# Patient Record
Sex: Male | Born: 1960
Health system: Southern US, Community
[De-identification: ages and names within clinical notes are randomized; demographics above are authoritative.]

## PROBLEM LIST (undated history)

## (undated) DIAGNOSIS — R51 Headache: Secondary | ICD-10-CM

## (undated) DIAGNOSIS — R569 Unspecified convulsions: Secondary | ICD-10-CM

## (undated) DIAGNOSIS — M1990 Unspecified osteoarthritis, unspecified site: Secondary | ICD-10-CM

## (undated) DIAGNOSIS — I10 Essential (primary) hypertension: Secondary | ICD-10-CM

## (undated) DIAGNOSIS — E039 Hypothyroidism, unspecified: Secondary | ICD-10-CM

## (undated) DIAGNOSIS — I2510 Atherosclerotic heart disease of native coronary artery without angina pectoris: Secondary | ICD-10-CM

## (undated) DIAGNOSIS — Z87442 Personal history of urinary calculi: Secondary | ICD-10-CM

## (undated) HISTORY — PX: OTHER SURGICAL HISTORY: SHX169

---

## 2001-09-07 HISTORY — PX: BACK SURGERY: SHX140

## 2002-06-20 ENCOUNTER — Ambulatory Visit (HOSPITAL_COMMUNITY): Admission: RE | Admit: 2002-06-20 | Discharge: 2002-06-21 | Payer: Self-pay | Admitting: Neurosurgery

## 2002-06-20 ENCOUNTER — Encounter: Payer: Self-pay | Admitting: Neurosurgery

## 2002-07-13 ENCOUNTER — Encounter: Admission: RE | Admit: 2002-07-13 | Discharge: 2002-07-13 | Payer: Self-pay | Admitting: Neurosurgery

## 2002-07-13 ENCOUNTER — Encounter: Payer: Self-pay | Admitting: Neurosurgery

## 2004-08-20 ENCOUNTER — Ambulatory Visit: Payer: Self-pay | Admitting: Family Medicine

## 2004-09-24 ENCOUNTER — Ambulatory Visit: Payer: Self-pay | Admitting: Family Medicine

## 2005-05-18 ENCOUNTER — Ambulatory Visit: Payer: Self-pay | Admitting: Family Medicine

## 2006-09-07 HISTORY — PX: BACK SURGERY: SHX140

## 2006-10-26 ENCOUNTER — Ambulatory Visit (HOSPITAL_COMMUNITY): Admission: RE | Admit: 2006-10-26 | Discharge: 2006-10-26 | Payer: Self-pay | Admitting: Neurosurgery

## 2012-12-12 ENCOUNTER — Encounter (HOSPITAL_COMMUNITY): Payer: Self-pay | Admitting: Pharmacy Technician

## 2012-12-12 ENCOUNTER — Encounter (HOSPITAL_COMMUNITY): Payer: Self-pay | Admitting: *Deleted

## 2012-12-12 NOTE — Pre-Procedure Instructions (Addendum)
Asked to bring blue folder the day of the procedure,insurance card,I.D. driver's license,wear comfortable clothing and have a driver for the day. Asked not to take Advil,Motrin,Ibuprofen,Aleve or any NSAIDS, Aspirin, Aspirin products or Toradol for 72 hours prior to procedure,  No vitamins or herbal medications 7 days prior to procedure. Instructed to take laxative per doctor's office instructions and eat a light dinner the evening before procedure.   To arrive at 1530  for lithotripsy procedure. 

## 2012-12-15 ENCOUNTER — Ambulatory Visit (HOSPITAL_COMMUNITY): Payer: BC Managed Care – PPO

## 2012-12-15 ENCOUNTER — Encounter (HOSPITAL_COMMUNITY)
Admission: RE | Disposition: A | Discharge status: Home / Self Care | Payer: Self-pay | Source: Ambulatory Visit | Attending: Urology

## 2012-12-15 ENCOUNTER — Ambulatory Visit (HOSPITAL_COMMUNITY)
Admission: RE | Admit: 2012-12-15 | Discharge: 2012-12-15 | Disposition: A | Discharge status: Home / Self Care | Payer: BC Managed Care – PPO | Source: Ambulatory Visit | Attending: Urology | Admitting: Urology

## 2012-12-15 ENCOUNTER — Encounter (HOSPITAL_COMMUNITY): Payer: Self-pay | Admitting: *Deleted

## 2012-12-15 DIAGNOSIS — N200 Calculus of kidney: Secondary | ICD-10-CM | POA: Insufficient documentation

## 2012-12-15 DIAGNOSIS — E039 Hypothyroidism, unspecified: Secondary | ICD-10-CM | POA: Insufficient documentation

## 2012-12-15 DIAGNOSIS — I10 Essential (primary) hypertension: Secondary | ICD-10-CM | POA: Insufficient documentation

## 2012-12-15 HISTORY — DX: Hypothyroidism, unspecified: E03.9

## 2012-12-15 HISTORY — DX: Unspecified convulsions: R56.9

## 2012-12-15 HISTORY — DX: Headache: R51

## 2012-12-15 SURGERY — LITHOTRIPSY, ESWL
Anesthesia: LOCAL | Laterality: Right

## 2012-12-15 MED ORDER — DIAZEPAM 5 MG PO TABS
10.0000 mg | ORAL_TABLET | Freq: Once | ORAL | Status: AC
  Administered 2012-12-15: 10 mg via ORAL
  Filled 2012-12-15: qty 2

## 2012-12-15 MED ORDER — LEVOFLOXACIN IN D5W 500 MG/100ML IV SOLN
500.0000 mg | Freq: Once | INTRAVENOUS | Status: AC
  Administered 2012-12-15: 500 mg via INTRAVENOUS
  Filled 2012-12-15: qty 100

## 2012-12-15 MED ORDER — GENTAMICIN SULFATE 40 MG/ML IJ SOLN
5.0000 mg/kg | Freq: Once | INTRAMUSCULAR | Status: DC
  Filled 2012-12-15: qty 8.84

## 2012-12-15 MED ORDER — DIAZEPAM 2 MG PO TABS: 10.0000 mg | ORAL_TABLET | Freq: Once | ORAL | Status: DC

## 2012-12-15 MED ORDER — DIPHENHYDRAMINE HCL 25 MG PO TABS
25.0000 mg | ORAL_TABLET | Freq: Once | ORAL | Status: AC
  Administered 2012-12-15: 25 mg via ORAL
  Filled 2012-12-15 (×2): qty 1

## 2012-12-15 MED ORDER — LACTATED RINGERS IV SOLN
INTRAVENOUS | Status: DC
  Administered 2012-12-15: 16:00:00 via INTRAVENOUS

## 2012-12-15 NOTE — Progress Notes (Addendum)
Levaquin started at 1623. Patient began sneezing (12 times), eyes starting itching, slightly puffy and reddened. Levaquin stopped at 1650. Continued Lactated Ringers. Stated he felt better and has this reaction after mowing yard  (he did today) due to seasonal allergies. Called Dr. Saddie Benders. Instructed to give Gentamicin instead. Gentamicin arrived after patient went to [place] truck. Steward Drone (nurse on litho truck) hung Gentamicin and documented.

## 2015-12-31 DIAGNOSIS — J018 Other acute sinusitis: Secondary | ICD-10-CM | POA: Diagnosis not present

## 2016-01-22 DIAGNOSIS — E782 Mixed hyperlipidemia: Secondary | ICD-10-CM | POA: Diagnosis not present

## 2016-01-22 DIAGNOSIS — I10 Essential (primary) hypertension: Secondary | ICD-10-CM | POA: Diagnosis not present

## 2016-01-22 DIAGNOSIS — R7301 Impaired fasting glucose: Secondary | ICD-10-CM | POA: Diagnosis not present

## 2016-01-22 DIAGNOSIS — E034 Atrophy of thyroid (acquired): Secondary | ICD-10-CM | POA: Diagnosis not present

## 2016-02-26 DIAGNOSIS — K219 Gastro-esophageal reflux disease without esophagitis: Secondary | ICD-10-CM | POA: Diagnosis not present

## 2016-03-24 DIAGNOSIS — H5213 Myopia, bilateral: Secondary | ICD-10-CM | POA: Diagnosis not present

## 2016-06-30 DIAGNOSIS — M7712 Lateral epicondylitis, left elbow: Secondary | ICD-10-CM | POA: Diagnosis not present

## 2016-07-24 DIAGNOSIS — I10 Essential (primary) hypertension: Secondary | ICD-10-CM | POA: Diagnosis not present

## 2016-07-24 DIAGNOSIS — R7301 Impaired fasting glucose: Secondary | ICD-10-CM | POA: Diagnosis not present

## 2016-07-24 DIAGNOSIS — E782 Mixed hyperlipidemia: Secondary | ICD-10-CM | POA: Diagnosis not present

## 2016-07-24 DIAGNOSIS — E034 Atrophy of thyroid (acquired): Secondary | ICD-10-CM | POA: Diagnosis not present

## 2016-07-28 DIAGNOSIS — R7301 Impaired fasting glucose: Secondary | ICD-10-CM | POA: Diagnosis not present

## 2016-07-28 DIAGNOSIS — E782 Mixed hyperlipidemia: Secondary | ICD-10-CM | POA: Diagnosis not present

## 2016-07-28 DIAGNOSIS — E034 Atrophy of thyroid (acquired): Secondary | ICD-10-CM | POA: Diagnosis not present

## 2016-07-28 DIAGNOSIS — I10 Essential (primary) hypertension: Secondary | ICD-10-CM | POA: Diagnosis not present

## 2016-09-07 HISTORY — PX: BACK SURGERY: SHX140

## 2016-11-30 DIAGNOSIS — R55 Syncope and collapse: Secondary | ICD-10-CM | POA: Diagnosis not present

## 2016-11-30 DIAGNOSIS — M7712 Lateral epicondylitis, left elbow: Secondary | ICD-10-CM | POA: Diagnosis not present

## 2016-12-01 DIAGNOSIS — R55 Syncope and collapse: Secondary | ICD-10-CM | POA: Diagnosis not present

## 2016-12-22 DIAGNOSIS — Z23 Encounter for immunization: Secondary | ICD-10-CM | POA: Diagnosis not present

## 2016-12-23 DIAGNOSIS — Z23 Encounter for immunization: Secondary | ICD-10-CM | POA: Diagnosis not present

## 2017-01-28 DIAGNOSIS — L814 Other melanin hyperpigmentation: Secondary | ICD-10-CM | POA: Diagnosis not present

## 2017-01-28 DIAGNOSIS — L820 Inflamed seborrheic keratosis: Secondary | ICD-10-CM | POA: Diagnosis not present

## 2017-01-28 DIAGNOSIS — L578 Other skin changes due to chronic exposure to nonionizing radiation: Secondary | ICD-10-CM | POA: Diagnosis not present

## 2017-02-09 DIAGNOSIS — M5416 Radiculopathy, lumbar region: Secondary | ICD-10-CM | POA: Diagnosis not present

## 2017-02-12 ENCOUNTER — Other Ambulatory Visit: Payer: Self-pay | Admitting: Neurosurgery

## 2017-02-12 DIAGNOSIS — M5416 Radiculopathy, lumbar region: Secondary | ICD-10-CM

## 2017-02-15 ENCOUNTER — Ambulatory Visit
Admission: RE | Admit: 2017-02-15 | Discharge: 2017-02-15 | Disposition: A | Discharge status: Home / Self Care | Payer: BLUE CROSS/BLUE SHIELD | Source: Ambulatory Visit | Attending: Neurosurgery | Admitting: Neurosurgery

## 2017-02-15 DIAGNOSIS — M5416 Radiculopathy, lumbar region: Secondary | ICD-10-CM

## 2017-02-15 DIAGNOSIS — M48061 Spinal stenosis, lumbar region without neurogenic claudication: Secondary | ICD-10-CM | POA: Diagnosis not present

## 2017-02-15 MED ORDER — GADOBENATE DIMEGLUMINE 529 MG/ML IV SOLN
20.0000 mL | Freq: Once | INTRAVENOUS | Status: AC | PRN
  Administered 2017-02-15: 20 mL via INTRAVENOUS

## 2017-03-04 DIAGNOSIS — M4310 Spondylolisthesis, site unspecified: Secondary | ICD-10-CM | POA: Diagnosis not present

## 2017-03-04 DIAGNOSIS — M7138 Other bursal cyst, other site: Secondary | ICD-10-CM | POA: Diagnosis not present

## 2017-03-04 DIAGNOSIS — M5416 Radiculopathy, lumbar region: Secondary | ICD-10-CM | POA: Diagnosis not present

## 2017-03-05 ENCOUNTER — Other Ambulatory Visit: Payer: Self-pay | Admitting: Neurosurgery

## 2017-03-08 DIAGNOSIS — I9589 Other hypotension: Secondary | ICD-10-CM | POA: Diagnosis not present

## 2017-03-08 DIAGNOSIS — R5383 Other fatigue: Secondary | ICD-10-CM | POA: Diagnosis not present

## 2017-04-08 DIAGNOSIS — I10 Essential (primary) hypertension: Secondary | ICD-10-CM | POA: Diagnosis not present

## 2017-05-07 ENCOUNTER — Other Ambulatory Visit (HOSPITAL_COMMUNITY): Payer: Self-pay | Admitting: *Deleted

## 2017-05-07 ENCOUNTER — Encounter (HOSPITAL_COMMUNITY): Payer: Self-pay

## 2017-05-07 ENCOUNTER — Encounter (HOSPITAL_COMMUNITY)
Admission: RE | Admit: 2017-05-07 | Discharge: 2017-05-07 | Disposition: A | Discharge status: Home / Self Care | Payer: BLUE CROSS/BLUE SHIELD | Source: Ambulatory Visit | Attending: Neurosurgery | Admitting: Neurosurgery

## 2017-05-07 DIAGNOSIS — I10 Essential (primary) hypertension: Secondary | ICD-10-CM | POA: Insufficient documentation

## 2017-05-07 DIAGNOSIS — Z0183 Encounter for blood typing: Secondary | ICD-10-CM | POA: Insufficient documentation

## 2017-05-07 DIAGNOSIS — Z01818 Encounter for other preprocedural examination: Secondary | ICD-10-CM | POA: Insufficient documentation

## 2017-05-07 DIAGNOSIS — R001 Bradycardia, unspecified: Secondary | ICD-10-CM | POA: Diagnosis not present

## 2017-05-07 DIAGNOSIS — M7138 Other bursal cyst, other site: Secondary | ICD-10-CM | POA: Insufficient documentation

## 2017-05-07 DIAGNOSIS — Z01812 Encounter for preprocedural laboratory examination: Secondary | ICD-10-CM | POA: Insufficient documentation

## 2017-05-07 HISTORY — DX: Essential (primary) hypertension: I10

## 2017-05-07 HISTORY — DX: Personal history of urinary calculi: Z87.442

## 2017-05-07 LAB — CBC WITH DIFFERENTIAL/PLATELET
BASOS PCT: 1 %
Basophils Absolute: 0.1 10*3/uL (ref 0.0–0.1)
EOS ABS: 0.3 10*3/uL (ref 0.0–0.7)
Eosinophils Relative: 4 %
HCT: 44.8 % (ref 39.0–52.0)
Hemoglobin: 15.2 g/dL (ref 13.0–17.0)
LYMPHS ABS: 1.5 10*3/uL (ref 0.7–4.0)
Lymphocytes Relative: 21 %
MCH: 30.6 pg (ref 26.0–34.0)
MCHC: 33.9 g/dL (ref 30.0–36.0)
MCV: 90.1 fL (ref 78.0–100.0)
MONO ABS: 0.6 10*3/uL (ref 0.1–1.0)
Monocytes Relative: 9 %
Neutro Abs: 4.6 10*3/uL (ref 1.7–7.7)
Neutrophils Relative %: 65 %
Platelets: 210 10*3/uL (ref 150–400)
RBC: 4.97 MIL/uL (ref 4.22–5.81)
RDW: 12.7 % (ref 11.5–15.5)
WBC: 7.1 10*3/uL (ref 4.0–10.5)

## 2017-05-07 LAB — BASIC METABOLIC PANEL
ANION GAP: 7 (ref 5–15)
BUN: 13 mg/dL (ref 6–20)
CHLORIDE: 105 mmol/L (ref 101–111)
CO2: 28 mmol/L (ref 22–32)
Calcium: 9.8 mg/dL (ref 8.9–10.3)
Creatinine, Ser: 0.9 mg/dL (ref 0.61–1.24)
GFR calc non Af Amer: >60 mL/min (ref >60)
GLUCOSE: 107 mg/dL — AB (ref 65–99)
POTASSIUM: 4.7 mmol/L (ref 3.5–5.1)
Sodium: 140 mmol/L (ref 135–145)

## 2017-05-07 LAB — SURGICAL PCR SCREEN
MRSA, PCR: NEGATIVE
Staphylococcus aureus: NEGATIVE

## 2017-05-07 LAB — ABO/RH: ABO/RH(D): A POS

## 2017-05-07 LAB — TYPE AND SCREEN
ABO/RH(D): A POS
ANTIBODY SCREEN: NEGATIVE

## 2017-05-07 NOTE — Pre-Procedure Instructions (Signed)
Anthony Soto  05/07/2017      [facility] - [place] - [address] [place] [address] Phone: [phone] Fax: [phone]  [facility] - [place], [place] - [address] [place] [address] Phone: [phone] Fax: [phone]   Your procedure is scheduled on Tuesday, May 18, 2017 at 8:00 AM.   Report to [facility] Entrance "A" Admitting Office at 6:00 AM.   Call this number if you have problems the morning of surgery: [phone]   Questions prior to day of surgery, please call [phone] between 8 & 4 PM.   Remember:  Do not eat food or drink liquids after midnight Monday, 05/17/17.  Take these medicines the morning of surgery with A SIP OF WATER: Levothyroxine (Synthroid), Loratadine (Claritin) - if needed  Stop Aspirin 5 days prior to surgery. Do not use NSAIDS (Ibuprofen, Aleve, etc) 5 days prior to surgery.   Do not wear jewelry.  Do not wear lotions, powders, cologne or deodorant.  Men may shave face and neck.  Do not bring valuables to the hospital.  [facility] is not responsible for any belongings or valuables.  Contacts, dentures or bridgework may not be worn into surgery.  Leave your suitcase in the car.  After surgery it may be brought to your room.  For patients admitted to the hospital, discharge time will be determined by your treatment team.  [facility] - Preparing for Surgery  Before surgery, you can play an important role.  Because skin is not sterile, your skin needs to be as free of germs as possible.  You can reduce the number of germs on you skin by washing with CHG (chlorahexidine gluconate) soap before surgery.  CHG is an antiseptic cleaner which kills germs and bonds with the skin to continue killing germs even after washing.  Please DO NOT use if you have an allergy to CHG or antibacterial soaps.  If your skin becomes reddened/irritated  stop using the CHG and inform your nurse when you arrive at Short Stay.  Do not shave (including legs and underarms) for at least 48 hours prior to the first CHG shower.  You may shave your face.  Please follow these instructions carefully:   1.  Shower with CHG Soap the night before surgery and the                    morning of Surgery.  2.  If you choose to wash your hair, wash your hair first as usual with your       normal shampoo.  3.  After you shampoo, rinse your hair and body thoroughly to remove the shampoo.  4.  Use CHG as you would any other liquid soap.  You can apply chg directly       to the skin and wash gently with scrungie or a clean washcloth.  5.  Apply the CHG Soap to your body ONLY FROM THE NECK DOWN.        Do not use on open wounds or open sores.  Avoid contact with your eyes, ears, mouth and genitals (private parts).  Wash genitals (private parts)  with your normal soap.  6.  Wash thoroughly, paying special attention to the area where your surgery        will be performed.  7.  Thoroughly rinse your body with warm water  from the neck down.  8.  DO NOT shower/wash with your normal soap after using and rinsing off       the CHG Soap.  9.  Pat yourself dry with a clean towel.            10.  Wear clean pajamas.            11.  Place clean sheets on your bed the night of your first shower and do not        sleep with pets.  Day of Surgery  Do not apply any lotions/deodorants the morning of surgery.  Please wear clean clothes to the hospital.   Please read over the fact sheets that you were given.

## 2017-05-07 NOTE — Progress Notes (Signed)
PCP is Bl[place] Denies ever seeing a cardiologist. Denies and cough, fever, shortness of breath or chest pain. Denies ever having a card cath, stress test, or echo.

## 2017-05-17 ENCOUNTER — Encounter (HOSPITAL_COMMUNITY): Payer: Self-pay | Admitting: Anesthesiology

## 2017-05-17 NOTE — Anesthesia Preprocedure Evaluation (Addendum)
Anesthesia Evaluation  Patient identified by MRN, date of birth, ID band Patient awake    Reviewed: Allergy & Precautions, NPO status , Patient's Chart, lab work & pertinent test results  Airway Mallampati: I  TM Distance: >3 FB Neck ROM: Full    Dental  (+) Teeth Intact, Dental Advisory Given   Pulmonary neg pulmonary ROS,    breath sounds clear to auscultation       Cardiovascular hypertension, Pt. on medications  Rhythm:Regular Rate:Bradycardia     Neuro/Psych  Headaches, Seizures -,     GI/Hepatic negative GI ROS, Neg liver ROS,   Endo/Other  Hypothyroidism   Renal/GU negative Renal ROS     Musculoskeletal negative musculoskeletal ROS (+)   Abdominal   Peds  Hematology negative hematology ROS (+)   Anesthesia Other Findings Day of surgery medications reviewed with the patient.  Reproductive/Obstetrics                            Lab Results  Component Value Date   WBC 7.1 05/07/2017   HGB 15.2 05/07/2017   HCT 44.8 05/07/2017   MCV 90.1 05/07/2017   PLT 210 05/07/2017   Lab Results  Component Value Date   CREATININE 0.90 05/07/2017   BUN 13 05/07/2017   NA 140 05/07/2017   K 4.7 05/07/2017   CL 105 05/07/2017   CO2 28 05/07/2017   No results found for: INR, PROTIME  EKG: SB  Anesthesia Physical Anesthesia Plan  ASA: II  Anesthesia Plan: General   Post-op Pain Management:    Induction: Intravenous  PONV Risk Score and Plan: 3 and Ondansetron, Dexamethasone, Midazolam and Treatment may vary due to age or medical condition  Airway Management Planned: Oral ETT  Additional Equipment:   Intra-op Plan:   Post-operative Plan: Extubation in OR  Informed Consent: I have reviewed the patients History and Physical, chart, labs and discussed the procedure including the risks, benefits and alternatives for the proposed anesthesia with the patient or authorized  representative who has indicated his/her understanding and acceptance.   Dental advisory given  Plan Discussed with: CRNA  Anesthesia Plan Comments:         Anesthesia Quick Evaluation

## 2017-05-18 ENCOUNTER — Inpatient Hospital Stay (HOSPITAL_COMMUNITY): Payer: BLUE CROSS/BLUE SHIELD

## 2017-05-18 ENCOUNTER — Inpatient Hospital Stay (HOSPITAL_COMMUNITY): Payer: BLUE CROSS/BLUE SHIELD | Admitting: Anesthesiology

## 2017-05-18 ENCOUNTER — Inpatient Hospital Stay (HOSPITAL_COMMUNITY): Payer: BLUE CROSS/BLUE SHIELD | Admitting: Vascular Surgery

## 2017-05-18 ENCOUNTER — Encounter (HOSPITAL_COMMUNITY)
Admission: RE | Disposition: A | Discharge status: Home / Self Care | Payer: Self-pay | Source: Ambulatory Visit | Attending: Neurosurgery

## 2017-05-18 ENCOUNTER — Encounter (HOSPITAL_COMMUNITY): Payer: Self-pay | Admitting: *Deleted

## 2017-05-18 ENCOUNTER — Inpatient Hospital Stay (HOSPITAL_COMMUNITY)
Admission: RE | Admit: 2017-05-18 | Discharge: 2017-05-18 | DRG: 460 | Disposition: A | Discharge status: Home / Self Care | Payer: BLUE CROSS/BLUE SHIELD | Source: Ambulatory Visit | Attending: Neurosurgery | Admitting: Neurosurgery

## 2017-05-18 DIAGNOSIS — Z880 Allergy status to penicillin: Secondary | ICD-10-CM | POA: Diagnosis not present

## 2017-05-18 DIAGNOSIS — Z881 Allergy status to other antibiotic agents status: Secondary | ICD-10-CM | POA: Diagnosis not present

## 2017-05-18 DIAGNOSIS — E039 Hypothyroidism, unspecified: Secondary | ICD-10-CM | POA: Diagnosis present

## 2017-05-18 DIAGNOSIS — M7138 Other bursal cyst, other site: Secondary | ICD-10-CM | POA: Diagnosis not present

## 2017-05-18 DIAGNOSIS — M48062 Spinal stenosis, lumbar region with neurogenic claudication: Principal | ICD-10-CM | POA: Diagnosis present

## 2017-05-18 DIAGNOSIS — I10 Essential (primary) hypertension: Secondary | ICD-10-CM | POA: Diagnosis not present

## 2017-05-18 DIAGNOSIS — M4326 Fusion of spine, lumbar region: Secondary | ICD-10-CM | POA: Diagnosis not present

## 2017-05-18 DIAGNOSIS — Z79899 Other long term (current) drug therapy: Secondary | ICD-10-CM

## 2017-05-18 DIAGNOSIS — Z7982 Long term (current) use of aspirin: Secondary | ICD-10-CM

## 2017-05-18 DIAGNOSIS — M48061 Spinal stenosis, lumbar region without neurogenic claudication: Secondary | ICD-10-CM | POA: Diagnosis not present

## 2017-05-18 DIAGNOSIS — M4316 Spondylolisthesis, lumbar region: Secondary | ICD-10-CM | POA: Diagnosis not present

## 2017-05-18 DIAGNOSIS — Z419 Encounter for procedure for purposes other than remedying health state, unspecified: Secondary | ICD-10-CM

## 2017-05-18 SURGERY — POSTERIOR LUMBAR FUSION 1 LEVEL
Anesthesia: General | Site: Spine Lumbar

## 2017-05-18 MED ORDER — HYDROMORPHONE HCL 1 MG/ML IJ SOLN
INTRAMUSCULAR | Status: AC
  Administered 2017-05-18: 0.5 mg via INTRAVENOUS
  Filled 2017-05-18: qty 1

## 2017-05-18 MED ORDER — BUPIVACAINE HCL (PF) 0.5 % IJ SOLN
INTRAMUSCULAR | Status: DC | PRN
  Administered 2017-05-18: 20 mL

## 2017-05-18 MED ORDER — LORATADINE 10 MG PO TABS: 10.0000 mg | ORAL_TABLET | Freq: Every day | ORAL | Status: DC | PRN

## 2017-05-18 MED ORDER — SODIUM CHLORIDE 0.9% FLUSH: 3.0000 mL | INTRAVENOUS | Status: DC | PRN

## 2017-05-18 MED ORDER — LACTATED RINGERS IV SOLN
INTRAVENOUS | Status: DC
  Administered 2017-05-18: 11:00:00 via INTRAVENOUS

## 2017-05-18 MED ORDER — EPHEDRINE SULFATE 50 MG/ML IJ SOLN
INTRAMUSCULAR | Status: DC | PRN
  Administered 2017-05-18: 10 mg via INTRAVENOUS

## 2017-05-18 MED ORDER — PROPOFOL 10 MG/ML IV BOLUS
INTRAVENOUS | Status: AC
  Filled 2017-05-18: qty 20

## 2017-05-18 MED ORDER — HYDROMORPHONE HCL 1 MG/ML IJ SOLN
0.2500 mg | INTRAMUSCULAR | Status: DC | PRN
  Administered 2017-05-18 (×3): 0.5 mg via INTRAVENOUS

## 2017-05-18 MED ORDER — MIDAZOLAM HCL 2 MG/2ML IJ SOLN
INTRAMUSCULAR | Status: AC
  Filled 2017-05-18: qty 2

## 2017-05-18 MED ORDER — VANCOMYCIN HCL 10 G IV SOLR
1250.0000 mg | Freq: Once | INTRAVENOUS | Status: DC
  Filled 2017-05-18: qty 1250

## 2017-05-18 MED ORDER — MEPERIDINE HCL 25 MG/ML IJ SOLN: 6.2500 mg | INTRAMUSCULAR | Status: DC | PRN

## 2017-05-18 MED ORDER — ROSUVASTATIN CALCIUM 5 MG PO TABS: 5.0000 mg | ORAL_TABLET | Freq: Every day | ORAL | Status: DC

## 2017-05-18 MED ORDER — THROMBIN 20000 UNITS EX SOLR
CUTANEOUS | Status: DC | PRN
  Administered 2017-05-18: 20 mL via TOPICAL

## 2017-05-18 MED ORDER — EPHEDRINE 5 MG/ML INJ
INTRAVENOUS | Status: AC
  Filled 2017-05-18: qty 10

## 2017-05-18 MED ORDER — THROMBIN 5000 UNITS EX SOLR
CUTANEOUS | Status: AC
  Filled 2017-05-18: qty 5000

## 2017-05-18 MED ORDER — ONDANSETRON HCL 4 MG/2ML IJ SOLN: 4.0000 mg | Freq: Four times a day (QID) | INTRAMUSCULAR | Status: DC | PRN

## 2017-05-18 MED ORDER — LEVOTHYROXINE SODIUM 100 MCG PO TABS
50.0000 ug | ORAL_TABLET | Freq: Every day | ORAL | Status: DC
Start: 2017-05-19 — End: 2017-05-18

## 2017-05-18 MED ORDER — PROPOFOL 10 MG/ML IV BOLUS
INTRAVENOUS | Status: DC | PRN
  Administered 2017-05-18: 160 mg via INTRAVENOUS

## 2017-05-18 MED ORDER — MIDAZOLAM HCL 5 MG/5ML IJ SOLN
INTRAMUSCULAR | Status: DC | PRN
Start: 2017-05-18 — End: 2017-05-18
  Administered 2017-05-18: 2 mg via INTRAVENOUS

## 2017-05-18 MED ORDER — ONDANSETRON HCL 4 MG/2ML IJ SOLN
INTRAMUSCULAR | Status: AC
  Filled 2017-05-18: qty 2

## 2017-05-18 MED ORDER — ROCURONIUM BROMIDE 10 MG/ML (PF) SYRINGE
PREFILLED_SYRINGE | INTRAVENOUS | Status: AC
  Filled 2017-05-18: qty 5

## 2017-05-18 MED ORDER — PHENYLEPHRINE 40 MCG/ML (10ML) SYRINGE FOR IV PUSH (FOR BLOOD PRESSURE SUPPORT)
PREFILLED_SYRINGE | INTRAVENOUS | Status: AC
  Filled 2017-05-18: qty 10

## 2017-05-18 MED ORDER — VANCOMYCIN HCL 1000 MG IV SOLR
INTRAVENOUS | Status: DC | PRN
Start: 2017-05-18 — End: 2017-05-18
  Administered 2017-05-18: 1000 mg via TOPICAL

## 2017-05-18 MED ORDER — VANCOMYCIN HCL 1000 MG IV SOLR
INTRAVENOUS | Status: AC
  Filled 2017-05-18: qty 1000

## 2017-05-18 MED ORDER — FENTANYL CITRATE (PF) 250 MCG/5ML IJ SOLN
INTRAMUSCULAR | Status: AC
  Filled 2017-05-18: qty 5

## 2017-05-18 MED ORDER — SODIUM CHLORIDE 0.9% FLUSH: 3.0000 mL | Freq: Two times a day (BID) | INTRAVENOUS | Status: DC

## 2017-05-18 MED ORDER — LACTATED RINGERS IV SOLN
INTRAVENOUS | Status: DC
  Administered 2017-05-18 (×3): via INTRAVENOUS

## 2017-05-18 MED ORDER — HYDROCODONE-ACETAMINOPHEN 10-325 MG PO TABS
1.0000 | ORAL_TABLET | ORAL | Status: DC | PRN
  Administered 2017-05-18: 2 via ORAL
  Filled 2017-05-18: qty 2

## 2017-05-18 MED ORDER — DIAZEPAM 5 MG PO TABS
ORAL_TABLET | ORAL | Status: AC
  Filled 2017-05-18: qty 1

## 2017-05-18 MED ORDER — LIDOCAINE HCL (CARDIAC) 20 MG/ML IV SOLN
INTRAVENOUS | Status: DC | PRN
  Administered 2017-05-18: 60 mg via INTRAVENOUS

## 2017-05-18 MED ORDER — SODIUM CHLORIDE 0.9 % IR SOLN
Status: DC | PRN
  Administered 2017-05-18: 500 mL

## 2017-05-18 MED ORDER — FENTANYL CITRATE (PF) 100 MCG/2ML IJ SOLN
INTRAMUSCULAR | Status: DC | PRN
  Administered 2017-05-18: 100 ug via INTRAVENOUS
  Administered 2017-05-18: 50 ug via INTRAVENOUS
  Administered 2017-05-18: 100 ug via INTRAVENOUS

## 2017-05-18 MED ORDER — ACETAMINOPHEN 325 MG PO TABS: 650.0000 mg | ORAL_TABLET | ORAL | Status: DC | PRN

## 2017-05-18 MED ORDER — ONDANSETRON HCL 4 MG PO TABS: 4.0000 mg | ORAL_TABLET | Freq: Four times a day (QID) | ORAL | Status: DC | PRN

## 2017-05-18 MED ORDER — ROCURONIUM BROMIDE 100 MG/10ML IV SOLN
INTRAVENOUS | Status: DC | PRN
  Administered 2017-05-18: 30 mg via INTRAVENOUS
  Administered 2017-05-18: 50 mg via INTRAVENOUS

## 2017-05-18 MED ORDER — MENTHOL 3 MG MT LOZG: 1.0000 | LOZENGE | OROMUCOSAL | Status: DC | PRN

## 2017-05-18 MED ORDER — HYDROMORPHONE HCL 1 MG/ML IJ SOLN: 0.5000 mg | INTRAMUSCULAR | Status: DC | PRN

## 2017-05-18 MED ORDER — LIDOCAINE 2% (20 MG/ML) 5 ML SYRINGE
INTRAMUSCULAR | Status: AC
  Filled 2017-05-18: qty 5

## 2017-05-18 MED ORDER — DIAZEPAM 5 MG PO TABS
5.0000 mg | ORAL_TABLET | Freq: Four times a day (QID) | ORAL | Status: DC | PRN
  Administered 2017-05-18: 5 mg via ORAL

## 2017-05-18 MED ORDER — SODIUM CHLORIDE 0.9 % IV SOLN: 250.0000 mL | INTRAVENOUS | Status: DC

## 2017-05-18 MED ORDER — THROMBIN 20000 UNITS EX SOLR
CUTANEOUS | Status: AC
  Filled 2017-05-18: qty 20000

## 2017-05-18 MED ORDER — PROMETHAZINE HCL 25 MG/ML IJ SOLN: 6.2500 mg | INTRAMUSCULAR | Status: DC | PRN

## 2017-05-18 MED ORDER — ACETAMINOPHEN 650 MG RE SUPP: 650.0000 mg | RECTAL | Status: DC | PRN

## 2017-05-18 MED ORDER — PHENYLEPHRINE HCL 10 MG/ML IJ SOLN
INTRAMUSCULAR | Status: DC | PRN
  Administered 2017-05-18 (×4): 80 ug via INTRAVENOUS

## 2017-05-18 MED ORDER — PHENYLEPHRINE HCL 10 MG/ML IJ SOLN
INTRAVENOUS | Status: DC | PRN
  Administered 2017-05-18: 25 ug/min via INTRAVENOUS

## 2017-05-18 MED ORDER — SUGAMMADEX SODIUM 200 MG/2ML IV SOLN
INTRAVENOUS | Status: DC | PRN
  Administered 2017-05-18: 200 mg via INTRAVENOUS

## 2017-05-18 MED ORDER — PHENOL 1.4 % MT LIQD: 1.0000 | OROMUCOSAL | Status: DC | PRN

## 2017-05-18 MED ORDER — DIAZEPAM 5 MG PO TABS: 5.0000 mg | ORAL_TABLET | Freq: Four times a day (QID) | ORAL | 0 refills | Status: DC | PRN

## 2017-05-18 MED ORDER — SUGAMMADEX SODIUM 200 MG/2ML IV SOLN
INTRAVENOUS | Status: AC
  Filled 2017-05-18: qty 2

## 2017-05-18 MED ORDER — VANCOMYCIN HCL IN DEXTROSE 1-5 GM/200ML-% IV SOLN
1000.0000 mg | INTRAVENOUS | Status: AC
  Administered 2017-05-18: 1000 mg via INTRAVENOUS
  Filled 2017-05-18: qty 200

## 2017-05-18 MED ORDER — LISINOPRIL 20 MG PO TABS: 10.0000 mg | ORAL_TABLET | ORAL | Status: DC

## 2017-05-18 MED ORDER — DEXAMETHASONE SODIUM PHOSPHATE 10 MG/ML IJ SOLN
INTRAMUSCULAR | Status: AC
  Filled 2017-05-18: qty 1

## 2017-05-18 MED ORDER — HYDROCODONE-ACETAMINOPHEN 10-325 MG PO TABS: 1.0000 | ORAL_TABLET | ORAL | 0 refills | Status: DC | PRN

## 2017-05-18 MED ORDER — 0.9 % SODIUM CHLORIDE (POUR BTL) OPTIME
TOPICAL | Status: DC | PRN
  Administered 2017-05-18: 1000 mL

## 2017-05-18 MED ORDER — DEXAMETHASONE SODIUM PHOSPHATE 10 MG/ML IJ SOLN
10.0000 mg | INTRAMUSCULAR | Status: AC
  Administered 2017-05-18: 10 mg via INTRAVENOUS
  Filled 2017-05-18: qty 1

## 2017-05-18 MED ORDER — BUPIVACAINE HCL (PF) 0.5 % IJ SOLN
INTRAMUSCULAR | Status: AC
  Filled 2017-05-18: qty 30

## 2017-05-18 SURGICAL SUPPLY — 66 items
ADH SKN CLS APL DERMABOND .7 (GAUZE/BANDAGES/DRESSINGS) ×1
APL SKNCLS STERI-STRIP NONHPOA (GAUZE/BANDAGES/DRESSINGS) ×1
BAG DECANTER FOR FLEXI CONT (MISCELLANEOUS) ×2 IMPLANT
BENZOIN TINCTURE PRP APPL 2/3 (GAUZE/BANDAGES/DRESSINGS) ×2 IMPLANT
BLADE CLIPPER SURG (BLADE) ×1 IMPLANT
BUR CUTTER 7.0 ROUND (BURR) IMPLANT
BUR MATCHSTICK NEURO 3.0 LAGG (BURR) ×2 IMPLANT
CANISTER SUCT 3000ML PPV (MISCELLANEOUS) ×2 IMPLANT
CAP LCK SPNE (Orthopedic Implant) ×4 IMPLANT
CAP LOCK SPINE RADIUS (Orthopedic Implant) IMPLANT
CAP LOCKING (Orthopedic Implant) ×8 IMPLANT
CARTRIDGE OIL MAESTRO DRILL (MISCELLANEOUS) ×1 IMPLANT
CLSR STERI-STRIP ANTIMIC 1/2X4 (GAUZE/BANDAGES/DRESSINGS) ×1 IMPLANT
CONT SPEC 4OZ CLIKSEAL STRL BL (MISCELLANEOUS) ×2 IMPLANT
COVER BACK TABLE 60X90IN (DRAPES) ×2 IMPLANT
DECANTER SPIKE VIAL GLASS SM (MISCELLANEOUS) ×2 IMPLANT
DERMABOND ADVANCED (GAUZE/BANDAGES/DRESSINGS) ×1
DERMABOND ADVANCED .7 DNX12 (GAUZE/BANDAGES/DRESSINGS) ×1 IMPLANT
DEVICE INTERBODY ELEVATE 9X23 (Cage) ×2 IMPLANT
DIFFUSER DRILL AIR PNEUMATIC (MISCELLANEOUS) ×2 IMPLANT
DRAPE C-ARM 42X72 X-RAY (DRAPES) ×4 IMPLANT
DRAPE HALF SHEET 40X57 (DRAPES) IMPLANT
DRAPE LAPAROTOMY 100X72X124 (DRAPES) ×2 IMPLANT
DRAPE POUCH INSTRU U-SHP 10X18 (DRAPES) ×2 IMPLANT
DRAPE SURG 17X23 STRL (DRAPES) ×8 IMPLANT
DRSG OPSITE POSTOP 4X6 (GAUZE/BANDAGES/DRESSINGS) ×1 IMPLANT
DURAPREP 26ML APPLICATOR (WOUND CARE) ×2 IMPLANT
ELECT REM PT RETURN 9FT ADLT (ELECTROSURGICAL) ×2
ELECTRODE REM PT RTRN 9FT ADLT (ELECTROSURGICAL) ×1 IMPLANT
EVACUATOR 1/8 PVC DRAIN (DRAIN) ×1 IMPLANT
GAUZE SPONGE 4X4 12PLY STRL (GAUZE/BANDAGES/DRESSINGS) IMPLANT
GAUZE SPONGE 4X4 16PLY XRAY LF (GAUZE/BANDAGES/DRESSINGS) IMPLANT
GLOVE BIOGEL PI IND STRL 6.5 (GLOVE) IMPLANT
GLOVE BIOGEL PI IND STRL 7.0 (GLOVE) IMPLANT
GLOVE BIOGEL PI INDICATOR 6.5 (GLOVE) ×2
GLOVE BIOGEL PI INDICATOR 7.0 (GLOVE) ×1
GLOVE ECLIPSE 9.0 STRL (GLOVE) ×4 IMPLANT
GLOVE EXAM NITRILE LRG STRL (GLOVE) IMPLANT
GLOVE EXAM NITRILE XL STR (GLOVE) IMPLANT
GLOVE EXAM NITRILE XS STR PU (GLOVE) IMPLANT
GLOVE SURG SS PI 6.5 STRL IVOR (GLOVE) ×2 IMPLANT
GOWN STRL REUS W/ TWL LRG LVL3 (GOWN DISPOSABLE) IMPLANT
GOWN STRL REUS W/ TWL XL LVL3 (GOWN DISPOSABLE) ×2 IMPLANT
GOWN STRL REUS W/TWL 2XL LVL3 (GOWN DISPOSABLE) IMPLANT
GOWN STRL REUS W/TWL LRG LVL3 (GOWN DISPOSABLE) ×4
GOWN STRL REUS W/TWL XL LVL3 (GOWN DISPOSABLE) ×4
KIT BASIN OR (CUSTOM PROCEDURE TRAY) ×2 IMPLANT
KIT ROOM TURNOVER OR (KITS) ×2 IMPLANT
MILL MEDIUM DISP (BLADE) ×2 IMPLANT
NEEDLE HYPO 22GX1.5 SAFETY (NEEDLE) ×2 IMPLANT
NS IRRIG 1000ML POUR BTL (IV SOLUTION) ×2 IMPLANT
OIL CARTRIDGE MAESTRO DRILL (MISCELLANEOUS) ×2
PACK LAMINECTOMY NEURO (CUSTOM PROCEDURE TRAY) ×2 IMPLANT
ROD RADIUS 45MM (Rod) ×4 IMPLANT
ROD SPNL 45X5.5XNS TI RDS (Rod) IMPLANT
SCREW 5.75X45MM (Screw) ×4 IMPLANT
SPONGE SURGIFOAM ABS GEL 100 (HEMOSTASIS) ×2 IMPLANT
STRIP CLOSURE SKIN 1/2X4 (GAUZE/BANDAGES/DRESSINGS) ×4 IMPLANT
SUT VIC AB 0 CT1 18XCR BRD8 (SUTURE) ×2 IMPLANT
SUT VIC AB 0 CT1 8-18 (SUTURE) ×4
SUT VIC AB 2-0 CT1 18 (SUTURE) ×2 IMPLANT
SUT VIC AB 3-0 SH 8-18 (SUTURE) ×4 IMPLANT
TOWEL GREEN STERILE (TOWEL DISPOSABLE) ×3 IMPLANT
TOWEL GREEN STERILE FF (TOWEL DISPOSABLE) ×2 IMPLANT
TRAY FOLEY W/METER SILVER 16FR (SET/KITS/TRAYS/PACK) ×2 IMPLANT
WATER STERILE IRR 1000ML POUR (IV SOLUTION) ×2 IMPLANT

## 2017-05-18 NOTE — Anesthesia Postprocedure Evaluation (Signed)
Anesthesia Post Note  Patient: Anthony Soto  Procedure(s) Performed: Procedure(s) (LRB): Laminectomy for facet/synovial cyst - Lumbar three-Lumbar four  Posterior Lumbar Interbody Fusion with pedicle crews (N/A)     Patient location during evaluation: PACU Anesthesia Type: General Level of consciousness: awake and alert Pain management: pain level controlled Vital Signs Assessment: post-procedure vital signs reviewed and stable Respiratory status: spontaneous breathing, nonlabored ventilation, respiratory function stable and patient connected to nasal cannula oxygen Cardiovascular status: blood pressure returned to baseline and stable Postop Assessment: no signs of nausea or vomiting Anesthetic complications: no    Last Vitals:  Vitals:   05/18/17 1145 05/18/17 1230  BP:  122/76  Pulse: 73 80  Resp: 15 18  Temp: 36.7 C 36.7 C  SpO2: 96% 94%                  Shelton SilvasKevin D Kihanna Kamiya

## 2017-05-18 NOTE — Brief Op Note (Signed)
05/18/2017  10:24 AM  PATIENT:  Anthony Soto  56 y.o. male  PRE-OPERATIVE DIAGNOSIS:  Synovial cyst  POST-OPERATIVE DIAGNOSIS:  Synovial cyst  PROCEDURE:  Procedure(s): Laminectomy for facet/synovial cyst - Lumbar three-Lumbar four  Posterior Lumbar Interbody Fusion with pedicle crews (N/A)  SURGEON:  Surgeon(s) and Role:    * Julio SicksPool, Derinda Bartus, MD - Primary  PHYSICIAN ASSISTANT:   ASSISTANTS:    ANESTHESIA:   general  EBL:  Total I/O In: 1000 [I.V.:1000] Out: 700 [Urine:400; Blood:300]  BLOOD ADMINISTERED:none  DRAINS: none   LOCAL MEDICATIONS USED:  MARCAINE     SPECIMEN:  No Specimen  DISPOSITION OF SPECIMEN:  N/A  COUNTS:  YES  TOURNIQUET:  * No tourniquets in log *  DICTATION: .Dragon Dictation  PLAN OF CARE: Admit to inpatient   PATIENT DISPOSITION:  PACU - hemodynamically stable.   Delay start of Pharmacological VTE agent (>24hrs) due to surgical blood loss or risk of bleeding: yes

## 2017-05-18 NOTE — Transfer of Care (Signed)
Immediate Anesthesia Transfer of Care Note  Patient: Anthony Soto  Procedure(s) Performed: Procedure(s): Laminectomy for facet/synovial cyst - Lumbar three-Lumbar four  Posterior Lumbar Interbody Fusion with pedicle crews (N/A)  Patient Location: PACU  Anesthesia Type:General  Level of Consciousness: awake, alert , oriented and patient cooperative  Airway & Oxygen Therapy: Patient Spontanous Breathing  Post-op Assessment: Report given to RN, Post -op Vital signs reviewed and stable and Patient moving all extremities  Post vital signs: Reviewed and stable  Last Vitals:  Vitals:   05/18/17 0711  BP: (!) 157/85  Pulse: 74  Resp: 20  Temp: 36.7 C  SpO2: 99%    Last Pain:  Vitals:   05/18/17 0711  TempSrc: Oral      Patients Stated Pain Goal: 4 (05/18/17 0630)  Complications: No apparent anesthesia complications

## 2017-05-18 NOTE — Progress Notes (Signed)
Pharmacy Antibiotic Note  Anthony Soto is a 56 y.o. male admitted on 05/18/2017 with surgical prophylaxis.  Pharmacy has been consulted for vancomycin dosing.  Pre-op vancomycin 1g given this morning at 0710, no drain in place.   Plan: Vancomycin 1250mg  IV x1 to be given at 1830 Pharmacy to sign off as no future doses required     Temp (24hrs), Avg:97.9 F (36.6 C), Min:97.6 F (36.4 C), Max:98 F (36.7 C)  No results for input(s): WBC, CREATININE, LATICACIDVEN, VANCOTROUGH, VANCOPEAK, VANCORANDOM, GENTTROUGH, GENTPEAK, GENTRANDOM, TOBRATROUGH, TOBRAPEAK, TOBRARND, AMIKACINPEAK, AMIKACINTROU, AMIKACIN in the last 168 hours.  Estimated Creatinine Clearance: 90.6 mL/min (by C-G formula based on SCr of 0.9 mg/dL).    Allergies  Allergen Reactions  . Levaquin [Levofloxacin] Itching  . Penicillins Rash    Has patient had a PCN reaction causing immediate rash, facial/tongue/throat swelling, SOB or lightheadedness with hypotension: Yes Has patient had a PCN reaction causing severe rash involving mucus membranes or skin necrosis: No Has patient had a PCN reaction that required hospitalization: No Has patient had a PCN reaction occurring within the last 10 years: No If all of the above answers are "NO", then may proceed with Cephalosporin use.      Thank you for allowing pharmacy to be a part of this patient's care.  Thersea Manfredonia 05/18/2017 1:12 PM

## 2017-05-18 NOTE — Discharge Instructions (Signed)

## 2017-05-18 NOTE — Evaluation (Signed)
Physical Therapy Evaluation Patient Details Name: Anthony Soto MRN: [id number] DOB: [birth year] Today's Date: 05/18/2017   History of Present Illness  Pt is a 56 y/o male s/p L3-4 PLIF. PMH includse HTN, seizures, and 2 previous back surgeries.   Clinical Impression  Patient is s/p above surgery resulting in the deficits listed below (see PT Problem List). PTA, pt was independent with functional mobility and very active. Reported he walked up to 4 miles per day. Upon eval, pt presenting with post op pain and decreased balance. Required min guard to occasional min A for steadying during gait training. Pt reports wife will be available to assist as needed upon d/c home. Patient will benefit from skilled PT to increase their independence and safety with mobility (while adhering to their precautions) to allow discharge to the venue listed below. Will continue to follow acutely.      Follow Up Recommendations No PT follow up;Supervision for mobility/OOB    Equipment Recommendations  None recommended by PT    Recommendations for Other Services       Precautions / Restrictions Precautions Precautions: Back Precaution Booklet Issued: Yes (comment) Precaution Comments: Reviewed back precaution handout with pt.  Required Braces or Orthoses: Spinal Brace Spinal Brace: Lumbar corset;Applied in sitting position Restrictions Weight Bearing Restrictions: No      Mobility  Bed Mobility Overal bed mobility: Needs Assistance Bed Mobility: Sidelying to Sit;Rolling Rolling: Supervision Sidelying to sit: Supervision       General bed mobility comments: Supervision for safety. Demonstrated good log roll technique without cues.   Transfers Overall transfer level: Needs assistance Equipment used: None Transfers: Sit to/from Stand Sit to Stand: Min assist;Min guard         General transfer comment: Min A initially upon standing secondary to LOB. Otherwise min guard for safety.    Ambulation/Gait Ambulation/Gait assistance: Min guard;Min assist Ambulation Distance (Feet): 200 Feet Assistive device:  (IV pole ) Gait Pattern/deviations: Step-through pattern;Decreased stride length;Drifts right/left Gait velocity: Decreased Gait velocity interpretation: Below normal speed for age/gender General Gait Details: Slow, slightly unsteady gait. LOB X2 requiring min A for steadying.   Stairs            Wheelchair Mobility    Modified Rankin (Stroke Patients Only)       Balance Overall balance assessment: Needs assistance Sitting-balance support: No upper extremity supported;Feet supported Sitting balance-Leahy Scale: Good     Standing balance support: Single extremity supported;During functional activity;No upper extremity supported Standing balance-Leahy Scale: Fair Standing balance comment: Able to maintain static standing without UE support                             Pertinent Vitals/Pain Pain Assessment: 0-10 Pain Score: 6  Pain Location: back  Pain Descriptors / Indicators: Operative site guarding;Sore Pain Intervention(s): Limited activity within patient's tolerance;Monitored during session;Repositioned    Home Living Family/patient expects to be discharged to:: Private residence Living Arrangements: Spouse/significant other Available Help at Discharge: Family;Available 24 hours/day Type of Home: House Home Access: Stairs to enter Entrance Stairs-Rails: None (reports there is a wall ) Entrance Stairs-Number of Steps: 3 Home Layout: Two level;Able to live on main level with bedroom/bathroom Home Equipment: Shower seat - built in      Prior Function Level of Independence: Independent               Hand Dominance        Extremity/Trunk Assessment  Upper Extremity Assessment Upper Extremity Assessment: Defer to OT evaluation    Lower Extremity Assessment Lower Extremity Assessment: Overall WFL for tasks  assessed    Cervical / Trunk Assessment Cervical / Trunk Assessment: Other exceptions Cervical / Trunk Exceptions: s/p PLIF   Communication   Communication: No difficulties  Cognition Arousal/Alertness: Awake/alert Behavior During Therapy: WFL for tasks assessed/performed Overall Cognitive Status: Within Functional Limits for tasks assessed                                        General Comments General comments (skin integrity, edema, etc.): Educated about walking exercise program to be performed at home    Exercises     Assessment/Plan    PT Assessment Patient needs continued PT services  PT Problem List Decreased balance;Decreased mobility;Decreased knowledge of precautions;Pain       PT Treatment Interventions Gait training;Stair training;Functional mobility training;Therapeutic activities;Therapeutic exercise;Balance training;Neuromuscular re-education;Patient/family education    PT Goals (Current goals can be found in the Care Plan section)  Acute Rehab PT Goals Patient Stated Goal: to go home PT Goal Formulation: With patient Time For Goal Achievement: 05/25/17 Potential to Achieve Goals: Good    Frequency Min 5X/week   Barriers to discharge        Co-evaluation               AM-PAC PT "6 Clicks" Daily Activity  Outcome Measure Difficulty turning over in bed (including adjusting bedclothes, sheets and blankets)?: None Difficulty moving from lying on back to sitting on the side of the bed? : None Difficulty sitting down on and standing up from a chair with arms (e.g., wheelchair, bedside commode, etc,.)?: Unable Help needed moving to and from a bed to chair (including a wheelchair)?: A Little Help needed walking in hospital room?: A Little Help needed climbing 3-5 steps with a railing? : A Little 6 Click Score: 18    End of Session Equipment Utilized During Treatment: Back brace Activity Tolerance: Patient tolerated treatment  well Patient left: in bed;with call bell/phone within reach;with nursing/sitter in room;with family/visitor present (sitting EOB) Nurse Communication: Mobility status PT Visit Diagnosis: Unsteadiness on feet (R26.81);Other abnormalities of gait and mobility (R26.89);Pain Pain - part of body:  (back)    Time: [id number] PT Time Calculation (min) (ACUTE ONLY): 15 min   Charges:   PT Evaluation $PT Eval Low Complexity: 1 Low     PT G Codes:        Gladys DammeBrittany Lorain Fettes, PT, DPT  Acute Rehabilitation Services  Pager: [phone]   Lehman PromBrittany S Shala Baumbach 05/18/2017, 1:43 PM

## 2017-05-18 NOTE — Anesthesia Procedure Notes (Signed)
Procedure Name: Intubation Date/Time: 05/18/2017 8:18 AM Performed by: Rebekah Chesterfield L Pre-anesthesia Checklist: Patient identified, Emergency Drugs available, Suction available and Patient being monitored Patient Re-evaluated:Patient Re-evaluated prior to induction Oxygen Delivery Method: Circle System Utilized Preoxygenation: Pre-oxygenation with 100% oxygen Induction Type: IV induction Ventilation: Mask ventilation without difficulty Laryngoscope Size: Mac and 4 Grade View: Grade I Tube type: Oral Tube size: 7.5 mm Number of attempts: 1 Airway Equipment and Method: Stylet and Oral airway Placement Confirmation: ETT inserted through vocal cords under direct vision,  positive ETCO2 and breath sounds checked- equal and bilateral Secured at: 22 cm Tube secured with: Tape Dental Injury: Teeth and Oropharynx as per pre-operative assessment

## 2017-05-18 NOTE — Op Note (Signed)
Date of procedure: 05/18/2017  Date of dictation: Same  Service: Neurosurgery  Preoperative diagnosis: Left L3-4 adherent synovial cyst with stenosis, L3-4 grade 1 degenerative spondylolisthesis  Postoperative diagnosis: Same  Procedure Name: L3-4 bilateral decompressive laminotomies with resection of adherent synovial cyst requiring microdissection  L3-4 posterior lumbar interbody fusion utilizing interbody cages and locally harvested autograft  L3-L4 posterior lateral arthrodesis utilizing nonsegmental pedicle screw fixation and local autografting  Surgeon:Rebeccah Ivins A.Trueman Worlds, M.D.  Asst. Surgeon: None  Anesthesia: General  Indication: 56 year old male with back and bilateral lower extremity symptoms failing conservative management her workup demonstrates evidence of a grade 1 L3-4 revision spondylolisthesis with associated synovial cyst causing marked stenosis. Patient presents now for decompression and fusion after failing conservative management.  Operative note: After induction anesthesia, patient position prone onto Wilson frame and a properly padded. Lumbar region prepped and draped sterilely. Incision made overlying L3-4. Dissection performed bilaterally. Retractor placed. Fluoroscopy used. Levels confirmed. Decompressive laminotomies then performed using Leksell rongeurs Kerrison rongeurs to remove the inferior two thirds the lamina of L3 the entire inferior facet complex of L3 bilaterally and the majority the superior facet of L4 bilaterally. Ligament flavum elevated and resected. An adherent synovial cyst on the left-sided L3-4 was encountered. Microscope was used for microdissection and the cyst was carefully separated from the underlying dura. The cyst was completely resected. There is no evidence of injury to thecal sac and nerve roots. Bilateral discectomies and performed at L3-4. Disc space and prepared for interbody fusion. With the distractor placed the patient's right side disc  space was scraped and cleaned of all soft tissue. A 9 mm lordotic Med[employer] expandable cage packed with Marga Hootsakley harvested autograft was then impacted in place and expanded to its full extent. Distractor was removed patient's right side. Disc spaces with skin prepared on the right side. Morselized autograft was then packed in the interspace. A second cage packed with autograft was then packed into place and expanded to its full extent. Pedicles of L3 and L4 were then divided using surface landmarks and intraoperative fluoroscopy. Superficial bone around the pedicles and removed using high-speed drill. Each pedicles and probed using pedicle awl each pedicle awl track was then tapped with a screw tap. Each screw tap hole was probed and found to be solidly within bone. 5.75 x 45 mm radius brand screws from [employer] were then placed bilaterally at L3 and L4. Final images revealed good position the screws and the cages the proper upper level with normal alignment of the spine. Wounds and irrigated with antibiotic solution. Transverse processes and residual facets were decorticated. Morselize autograft was packed posterior laterally. Short segment titanium rod placed over the screw heads at L3 and L4. Locking caps placed over the screws. Locking caps and engaged with the construct under mild compression. Wounds and irrigated one final time. Gelfoam was placed topically for hemostasis. Vancomycin powder was placed the deep wound space. Wounds and close in layers with Vicryl sutures. Steri-Strips and sterile dressing were applied. No apparent complications. Patient tolerated the procedure well and he returns to the recovery room postop.

## 2017-05-18 NOTE — H&P (Signed)
  Anthony Soto is an 56 y.o. male.   Chief Complaint: Back pain HPI: 56 year old male with progressive back and bilateral lower extremity pain failing conservative management. Workup demonstrates evidence of marked facet arthropathy with an associated leftward synovial cyst and severe stenosis at L3-4. Patient is failed conservative management. He presents now for decompression and fusion.  Past Medical History:  Diagnosis Date  . Headache(784.0)    sinus  . History of kidney stones   . Hypertension   . Hypothyroidism   . Seizures (HCC)    25 yrs. ago from stress inducted    Past Surgical History:  Procedure Laterality Date  . BACK SURGERY  2008  . BACK SURGERY  2003   upper Back  . nasal septoplasty      Family History  Problem Relation Age of Onset  . Hypothyroidism Mother   . High blood pressure Father    Social History:  reports that he has never smoked. He has never used smokeless tobacco. He reports that he does not drink alcohol or use drugs.  Allergies:  Allergies  Allergen Reactions  . Levaquin [Levofloxacin] Itching  . Penicillins Rash    Has patient had a PCN reaction causing immediate rash, facial/tongue/throat swelling, SOB or lightheadedness with hypotension: Yes Has patient had a PCN reaction causing severe rash involving mucus membranes or skin necrosis: No Has patient had a PCN reaction that required hospitalization: No Has patient had a PCN reaction occurring within the last 10 years: No If all of the above answers are "NO", then may proceed with Cephalosporin use.     Medications Prior to Admission  Medication Sig Dispense Refill  . aspirin EC 81 MG tablet Take 81 mg by mouth daily.    . Homeopathic Products (PROSACEA) GEL Apply 1 application topically daily as needed (rosacea).    Marland Kitchen. levothyroxine (SYNTHROID, LEVOTHROID) 50 MCG tablet Take 50 mcg by mouth daily before breakfast.    . lisinopril (PRINIVIL,ZESTRIL) 10 MG tablet Take 10 mg by mouth  every other day.    . rosuvastatin (CRESTOR) 5 MG tablet Take 5 mg by mouth daily.    Marland Kitchen. loratadine (CLARITIN) 10 MG tablet Take 10 mg by mouth daily as needed for allergies.       No results found for this or any previous visit (from the past 48 hour(s)). No results found.  Pertinent items noted in HPI and remainder of comprehensive ROS otherwise negative.  Blood pressure (!) 157/85, pulse 74, temperature 98 F (36.7 C), temperature source Oral, resp. rate 20, SpO2 99 %.  Patient is awake and alert. He is oriented and appropriate. Speech is fluent. Judgment and insight are intact. Motor examination intact bilaterally sensory examination of some patchy distal sensory loss in both lower extremities. Deep tendon reflexes with hypoactive lower extremity reflexes and absent altogether Achilles reflexes bilaterally. Gait is antalgic. Posture is flexed. Examination head ears eyes and throat are marked. Chest and abdomen are benign. Extremities are free of major deformity. Assessment/Plan L3-4 stenosis with synovial cyst and neurogenic claudication. Plan bilateral L3-4 decompressive laminotomy with resection of synovial cyst followed by posterior lumbar body fusion utilizing interbody cages, locally harvested autograft, and augmented with posterior lateral pieces utilizing nonsegmental pedicle screw fixation and local autograft. Risks and benefits been explained. Patient wishes to proceed.  Anthony Soto A 05/18/2017, 7:50 AM

## 2017-05-18 NOTE — Progress Notes (Signed)
Pt doing well. Pt and wife given D/C instructions with Rx's, verbal understanding was provided. Pt's incision is clean and dry with no sign of infection. Pt's IV was removed prior to D/C. Pt D/C'd home via wheelchair @ 1525 per MD order. Pt is stable @ D/C and has no other needs at this time. Rema FendtAshley Atreus Hasz, RN

## 2017-05-18 NOTE — Discharge Summary (Signed)
Physician Discharge Summary  Patient ID: Anthony Soto MRN: [id number] DOB/AGE: 58/12/1960 58 y.o.  Admit date: 05/18/2017 Discharge date: 05/18/2017  Admission Diagnoses:  Discharge Diagnoses:  Active Problems:   Synovial cyst of lumbar facet joint   Discharged Condition: good  Hospital Course: Patient in the hospital where he underwent uncomplicated lumbar decompression and fusion. Postoperatively doing well. Back and lower extremity pain improved. Ambulating without difficulty. Ready for discharge home.  Consults:   Significant Diagnostic Studies:   Treatments:   Discharge Exam: Blood pressure 122/76, pulse 80, temperature 98 F (36.7 C), resp. rate 18, SpO2 94 %. Awake and alert. Oriented and appropriate. Cranial nerve function intact. Motor sensory function extremities normal. Wound clean and dry. Chest and abdomen benign.  Disposition: 01-Home or Self Care   Allergies as of 05/18/2017      Reactions   Levaquin [levofloxacin] Itching   Penicillins Rash   Has patient had a PCN reaction causing immediate rash, facial/tongue/throat swelling, SOB or lightheadedness with hypotension: Yes Has patient had a PCN reaction causing severe rash involving mucus membranes or skin necrosis: No Has patient had a PCN reaction that required hospitalization: No Has patient had a PCN reaction occurring within the last 10 years: No If all of the above answers are "NO", then may proceed with Cephalosporin use.      Medication List    TAKE these medications   aspirin EC 81 MG tablet Take 81 mg by mouth daily.   diazepam 5 MG tablet Commonly known as:  VALIUM Take 1-2 tablets (5-10 mg total) by mouth every 6 (six) hours as needed for muscle spasms.   HYDROcodone-acetaminophen 10-325 MG tablet Commonly known as:  NORCO Take 1-2 tablets by mouth every 4 (four) hours as needed (breakthrough pain).   levothyroxine 50 MCG tablet Commonly known as:  SYNTHROID, LEVOTHROID Take 50  mcg by mouth daily before breakfast.   lisinopril 10 MG tablet Commonly known as:  PRINIVIL,ZESTRIL Take 10 mg by mouth every other day.   loratadine 10 MG tablet Commonly known as:  CLARITIN Take 10 mg by mouth daily as needed for allergies.   PROSACEA Gel Apply 1 application topically daily as needed (rosacea).   rosuvastatin 5 MG tablet Commonly known as:  CRESTOR Take 5 mg by mouth daily.            Durable Medical Equipment        Start     Ordered   05/18/17 1210  DME Walker rolling  Once    Question:  Patient needs a walker to treat with the following condition  Answer:  Synovial cyst of lumbar facet joint   05/18/17 1209   05/18/17 1210  DME 3 n 1  Once     05/18/17 1209       Discharge Care Instructions        Start     Ordered   05/18/17 0000  HYDROcodone-acetaminophen (NORCO) 10-325 MG tablet  Every 4 hours PRN     05/18/17 1441   05/18/17 0000  diazepam (VALIUM) 5 MG tablet  Every 6 hours PRN     05/18/17 1441       Signed: Millissa Deese A 05/18/2017, 2:42 PM

## 2017-05-19 MED FILL — Sodium Chloride IV Soln 0.9%: INTRAVENOUS | Qty: 1000 | Status: AC

## 2017-05-19 MED FILL — Heparin Sodium (Porcine) Inj 1000 Unit/ML: INTRAMUSCULAR | Qty: 30 | Status: AC

## 2017-06-17 DIAGNOSIS — M4310 Spondylolisthesis, site unspecified: Secondary | ICD-10-CM | POA: Diagnosis not present

## 2017-06-18 DIAGNOSIS — Z87442 Personal history of urinary calculi: Secondary | ICD-10-CM | POA: Diagnosis not present

## 2017-06-18 DIAGNOSIS — R109 Unspecified abdominal pain: Secondary | ICD-10-CM | POA: Diagnosis not present

## 2017-06-18 DIAGNOSIS — N200 Calculus of kidney: Secondary | ICD-10-CM | POA: Diagnosis not present

## 2017-06-21 DIAGNOSIS — N202 Calculus of kidney with calculus of ureter: Secondary | ICD-10-CM | POA: Diagnosis not present

## 2017-06-21 DIAGNOSIS — N200 Calculus of kidney: Secondary | ICD-10-CM | POA: Diagnosis not present

## 2017-06-22 DIAGNOSIS — N201 Calculus of ureter: Secondary | ICD-10-CM | POA: Diagnosis not present

## 2017-06-22 DIAGNOSIS — N200 Calculus of kidney: Secondary | ICD-10-CM | POA: Diagnosis not present

## 2017-06-22 DIAGNOSIS — R1032 Left lower quadrant pain: Secondary | ICD-10-CM | POA: Diagnosis not present

## 2017-06-25 DIAGNOSIS — E039 Hypothyroidism, unspecified: Secondary | ICD-10-CM | POA: Diagnosis not present

## 2017-06-25 DIAGNOSIS — Z79899 Other long term (current) drug therapy: Secondary | ICD-10-CM | POA: Diagnosis not present

## 2017-06-25 DIAGNOSIS — I10 Essential (primary) hypertension: Secondary | ICD-10-CM | POA: Diagnosis not present

## 2017-06-25 DIAGNOSIS — N202 Calculus of kidney with calculus of ureter: Secondary | ICD-10-CM | POA: Diagnosis not present

## 2017-06-25 DIAGNOSIS — N200 Calculus of kidney: Secondary | ICD-10-CM | POA: Diagnosis not present

## 2017-06-25 DIAGNOSIS — N201 Calculus of ureter: Secondary | ICD-10-CM | POA: Diagnosis not present

## 2017-07-02 DIAGNOSIS — Z125 Encounter for screening for malignant neoplasm of prostate: Secondary | ICD-10-CM | POA: Diagnosis not present

## 2017-07-02 DIAGNOSIS — N401 Enlarged prostate with lower urinary tract symptoms: Secondary | ICD-10-CM | POA: Diagnosis not present

## 2017-07-02 DIAGNOSIS — N200 Calculus of kidney: Secondary | ICD-10-CM | POA: Diagnosis not present

## 2017-07-02 DIAGNOSIS — R1032 Left lower quadrant pain: Secondary | ICD-10-CM | POA: Diagnosis not present

## 2017-08-19 DIAGNOSIS — M4310 Spondylolisthesis, site unspecified: Secondary | ICD-10-CM | POA: Diagnosis not present

## 2017-08-19 DIAGNOSIS — Z6825 Body mass index (BMI) 25.0-25.9, adult: Secondary | ICD-10-CM | POA: Diagnosis not present

## 2017-08-19 DIAGNOSIS — I10 Essential (primary) hypertension: Secondary | ICD-10-CM | POA: Diagnosis not present

## 2017-08-27 DIAGNOSIS — Z23 Encounter for immunization: Secondary | ICD-10-CM | POA: Diagnosis not present

## 2017-10-15 DIAGNOSIS — I10 Essential (primary) hypertension: Secondary | ICD-10-CM | POA: Diagnosis not present

## 2017-10-15 DIAGNOSIS — E782 Mixed hyperlipidemia: Secondary | ICD-10-CM | POA: Diagnosis not present

## 2017-10-15 DIAGNOSIS — R7301 Impaired fasting glucose: Secondary | ICD-10-CM | POA: Diagnosis not present

## 2017-10-19 DIAGNOSIS — E034 Atrophy of thyroid (acquired): Secondary | ICD-10-CM | POA: Diagnosis not present

## 2017-10-19 DIAGNOSIS — E782 Mixed hyperlipidemia: Secondary | ICD-10-CM | POA: Diagnosis not present

## 2017-10-19 DIAGNOSIS — I10 Essential (primary) hypertension: Secondary | ICD-10-CM | POA: Diagnosis not present

## 2017-10-19 DIAGNOSIS — R7301 Impaired fasting glucose: Secondary | ICD-10-CM | POA: Diagnosis not present

## 2018-02-03 DIAGNOSIS — L03116 Cellulitis of left lower limb: Secondary | ICD-10-CM | POA: Diagnosis not present

## 2018-02-03 DIAGNOSIS — W57XXXA Bitten or stung by nonvenomous insect and other nonvenomous arthropods, initial encounter: Secondary | ICD-10-CM | POA: Diagnosis not present

## 2018-04-15 DIAGNOSIS — E782 Mixed hyperlipidemia: Secondary | ICD-10-CM | POA: Diagnosis not present

## 2018-04-15 DIAGNOSIS — E034 Atrophy of thyroid (acquired): Secondary | ICD-10-CM | POA: Diagnosis not present

## 2018-04-15 DIAGNOSIS — R7301 Impaired fasting glucose: Secondary | ICD-10-CM | POA: Diagnosis not present

## 2018-04-15 DIAGNOSIS — I10 Essential (primary) hypertension: Secondary | ICD-10-CM | POA: Diagnosis not present

## 2018-04-19 DIAGNOSIS — I10 Essential (primary) hypertension: Secondary | ICD-10-CM | POA: Diagnosis not present

## 2018-04-19 DIAGNOSIS — R7301 Impaired fasting glucose: Secondary | ICD-10-CM | POA: Diagnosis not present

## 2018-04-19 DIAGNOSIS — E782 Mixed hyperlipidemia: Secondary | ICD-10-CM | POA: Diagnosis not present

## 2018-04-19 DIAGNOSIS — E034 Atrophy of thyroid (acquired): Secondary | ICD-10-CM | POA: Diagnosis not present

## 2018-06-16 DIAGNOSIS — E034 Atrophy of thyroid (acquired): Secondary | ICD-10-CM | POA: Diagnosis not present

## 2018-10-19 DIAGNOSIS — E782 Mixed hyperlipidemia: Secondary | ICD-10-CM | POA: Diagnosis not present

## 2018-10-19 DIAGNOSIS — E034 Atrophy of thyroid (acquired): Secondary | ICD-10-CM | POA: Diagnosis not present

## 2018-10-19 DIAGNOSIS — I10 Essential (primary) hypertension: Secondary | ICD-10-CM | POA: Diagnosis not present

## 2018-10-20 DIAGNOSIS — I10 Essential (primary) hypertension: Secondary | ICD-10-CM | POA: Diagnosis not present

## 2018-10-20 DIAGNOSIS — E034 Atrophy of thyroid (acquired): Secondary | ICD-10-CM | POA: Diagnosis not present

## 2018-10-20 DIAGNOSIS — E782 Mixed hyperlipidemia: Secondary | ICD-10-CM | POA: Diagnosis not present

## 2018-10-20 DIAGNOSIS — R7301 Impaired fasting glucose: Secondary | ICD-10-CM | POA: Diagnosis not present

## 2018-11-21 DIAGNOSIS — H5213 Myopia, bilateral: Secondary | ICD-10-CM | POA: Diagnosis not present

## 2018-12-13 DIAGNOSIS — Z87442 Personal history of urinary calculi: Secondary | ICD-10-CM | POA: Diagnosis not present

## 2018-12-13 DIAGNOSIS — R1032 Left lower quadrant pain: Secondary | ICD-10-CM | POA: Diagnosis not present

## 2018-12-13 DIAGNOSIS — N200 Calculus of kidney: Secondary | ICD-10-CM | POA: Diagnosis not present

## 2018-12-13 DIAGNOSIS — Z125 Encounter for screening for malignant neoplasm of prostate: Secondary | ICD-10-CM | POA: Diagnosis not present

## 2018-12-13 DIAGNOSIS — Z981 Arthrodesis status: Secondary | ICD-10-CM | POA: Diagnosis not present

## 2018-12-13 DIAGNOSIS — N401 Enlarged prostate with lower urinary tract symptoms: Secondary | ICD-10-CM | POA: Diagnosis not present

## 2019-02-14 DIAGNOSIS — I10 Essential (primary) hypertension: Secondary | ICD-10-CM | POA: Diagnosis not present

## 2019-04-14 ENCOUNTER — Other Ambulatory Visit: Payer: Self-pay

## 2019-04-14 ENCOUNTER — Ambulatory Visit: Payer: BC Managed Care – PPO | Admitting: Cardiovascular Disease

## 2019-04-14 ENCOUNTER — Encounter: Payer: Self-pay | Admitting: Cardiovascular Disease

## 2019-04-14 VITALS — BP 134/82 | HR 105 | Ht 69.0 in | Wt 171.5 lb

## 2019-04-14 DIAGNOSIS — I10 Essential (primary) hypertension: Secondary | ICD-10-CM | POA: Diagnosis not present

## 2019-04-14 DIAGNOSIS — R0789 Other chest pain: Secondary | ICD-10-CM | POA: Diagnosis not present

## 2019-04-14 DIAGNOSIS — R079 Chest pain, unspecified: Secondary | ICD-10-CM

## 2019-04-14 MED ORDER — LOSARTAN POTASSIUM 25 MG PO TABS: 25.0000 mg | ORAL_TABLET | Freq: Every day | ORAL | 3 refills | Status: DC

## 2019-04-14 NOTE — Progress Notes (Signed)
Cardiology Office Note:    Date:  04/14/2019   ID:  Felicie MornRodger W Adler, DOB 03/23/1961, MRN [id number]  PCP:  Blane Oharaox, Kirsten, MD  Cardiologist:  Nahser  Electrophysiologist:  None   Referring MD: Blane Oharaox, Kirsten, MD   Chief Complaint  Patient presents with  . Hypertension    Aug. 7, 2020    Anthony Soto is a 58 y.o. male who we were asked to see by Dr. Sedalia Mutaox for HTN with variable BP and variable HR.  Has had variable BP   Has random episodes of chest squeezing.   Not related to exercise,   Not related to twising . 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 Headache(784.0)    sinus  . History of kidney stones   . Hypertension   . Hypothyroidism   . Seizures (HCC)    25 yrs. ago from stress inducted    Past Surgical History:  Procedure Laterality Date  . BACK SURGERY  2008  . BACK SURGERY  2003   upper Back  . nasal septoplasty      Current Medications: Current Meds  Medication Sig  . aspirin EC 81 MG tablet Take 81 mg by mouth daily.  . Homeopathic Products (PROSACEA) GEL Apply 1 application topically daily as needed (rosacea).  Marland Kitchen. levothyroxine (SYNTHROID) 75 MCG tablet Take 75 mcg by mouth daily.  Marland Kitchen. loratadine (CLARITIN) 10 MG tablet Take 10 mg by mouth daily as needed for allergies.   . rosuvastatin (CRESTOR) 5 MG tablet Take 5 mg by mouth daily.  . [DISCONTINUED] metoprolol succinate (TOPROL-XL) 25 MG 24 hr tablet Take 25 mg by mouth 2 (two) times daily.     Allergies:   Levaquin [levofloxacin] and Penicillins   Social History   Socioeconomic History  . Marital status: Married    Spouse name: Not on file  . Number of children: Not on file  . Years of  education: Not on file  . Highest education level: Not on file  Occupational History  . Not on file  Social Needs  . Financial resource strain: Not on file  . Food insecurity    Worry: Not on file    Inability: Not on file  . Transportation needs    Medical: Not on file    Non-medical: Not on file  Tobacco Use  . Smoking status: Never Smoker  . Smokeless tobacco: Never Used  Substance and Sexual Activity  . Alcohol use: No  . Drug use: No  . Sexual activity: Not on file  Lifestyle  . Physical activity    Days per week: Not on file    Minutes per session: Not on file  . Stress: Not on file  Relationships  . Social Musicianconnections    Talks on phone: Not on file    Gets together: Not on file    Attends religious service: Not on file    Active member of club or organization: Not on file    Attends meetings of clubs or organizations: Not on file    Relationship status: Not on file  Other Topics  Concern  . Not on file  Social History Narrative  . Not on file     Family History: The patient's family history includes High blood pressure in his father; Hypothyroidism in his mother.  ROS:   Please see the history of present illness.     All other systems reviewed and are negative.  EKGs/Labs/Other Studies Reviewed:    The following studies were reviewed today:   EKG:    Recent Labs: No results found for requested labs within last 8760 hours.  Recent Lipid Panel No results found for: CHOL, TRIG, HDL, CHOLHDL, VLDL, LDLCALC, LDLDIRECT  Physical Exam:    VS:  BP 134/82   Pulse (!) 105   Ht 5\' 9"  (1.753 m)   Wt 171 lb 8 oz (77.8 kg)   SpO2 96%   BMI 25.33 kg/m     Wt Readings from Last 3 Encounters:  04/14/19 171 lb 8 oz (77.8 kg)  05/07/17 174 lb 14.4 oz (79.3 kg)  12/15/12 185 lb 8 oz (84.1 kg)     GEN:  Well nourished, well developed in no acute distress HEENT: Normal NECK: No JVD; No carotid bruits LYMPHATICS: No lymphadenopathy CARDIAC: RRR, no murmurs,  rubs, gallops RESPIRATORY:  Clear to auscultation without rales, wheezing or rhonchi  ABDOMEN: Soft, non-tender, non-distended MUSCULOSKELETAL:  No edema; No deformity  SKIN: Warm and dry NEUROLOGIC:  Alert and oriented x 3 PSYCHIATRIC:  Normal affect   ASSESSMENT:    1. Chest pain of uncertain etiology   2. Essential hypertension    PLAN:    In order of problems listed above:  1.  HTN:   Will DC metoprolol .   Start Losartan 25 mg a day .    2.  BP variability:   I think he is volume depleted.  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  He had only had 2 bottles of Gatorade and 1 medium-sized bottle of water.  I told him that he should have had at least doubled this for such a job.  He is not urinated since he stopped working.  I told him that this was a sign that he was volume depleted.  He agrees that he probably does need to hydrate.  Encouraged him to hydrate better.   Try V-8 juice.   He will keep a diary of his bp   3.   Chest squeezing:   Will have him return to have a GXT   return in 3 months     Medication Adjustments/Labs and Tests Ordered: Current medicines are reviewed at length with the patient today.  Concerns regarding medicines are outlined above.  Orders Placed This Encounter  Procedures  . Exercise Tolerance Test  . EKG 12-Lead   Meds ordered this encounter  Medications  . losartan (COZAAR) 25 MG tablet    Sig: Take 1 tablet (25 mg total) by mouth daily.    Dispense:  90 tablet    Refill:  3    Patient Instructions  Medication Instructions:  Your physician has recommended you make the following change in your medication:  STOP Metoprolol START Losartan 25 mg once daily  If you need a refill on your cardiac medications before your next appointment, please call your pharmacy.   Lab work: Your Pre-procedure COVID-19 Testing will be done on __________ at [facility] After your swab you will be  given a mask to wear and  instructed to go home and quarantine/no visitors until after your procedure. If you test positive you will be notified and your procedure will be cancelled.     Testing/Procedures: Your physician has requested that you have an exercise tolerance test. For further information please visit [email or web address]. Please also follow instruction sheet, as given. Do not eat, drink, or use tobacco products for 4 hours before the test except you may drink water.  Dress in exercise clothes and shoes. Do not use any lotion, powder, or cologne on the morning of your test. You may use deodorant. You may take your morning medications.   Follow-Up: At [facility], you and your health needs are our priority.  As part of our continuing mission to provide you with exceptional heart care, we have created designated Provider Care Teams.  These Care Teams include your primary Cardiologist (physician) and Advanced Practice Providers (APPs -  Physician Assistants and Nurse Practitioners) who all work together to provide you with the care you need, when you need it. You will need a follow up appointment in:  2-3 months.  Please call our office 2 months in advance to schedule this appointment.  You may see Dr. Elease HashimotoNahser or one of the following Advanced Practice Providers on your designated Care Team: Tereso NewcomerScott Weaver, PA-C Vin LeolaBhagat, [place] JerseyPA-C . Berton BonJanine Hammond, NP      Signed, Kristeen MissPhilip Nahser, MD  04/14/2019 5:55 PM    [facility]

## 2019-04-14 NOTE — Patient Instructions (Addendum)
Medication Instructions:  Your physician has recommended you make the following change in your medication:  STOP Metoprolol START Losartan 25 mg once daily  If you need a refill on your cardiac medications before your next appointment, please call your pharmacy.   Lab work: Your Pre-procedure COVID-19 Testing will be done on __________ at [facility] After your swab you will be given a mask to wear and instructed to go home and quarantine/no visitors until after your procedure. If you test positive you will be notified and your procedure will be cancelled.     Testing/Procedures: Your physician has requested that you have an exercise tolerance test. For further information please visit [email or web address]. Please also follow instruction sheet, as given. Do not eat, drink, or use tobacco products for 4 hours before the test except you may drink water.  Dress in exercise clothes and shoes. Do not use any lotion, powder, or cologne on the morning of your test. You may use deodorant. You may take your morning medications.   Follow-Up: At [facility], you and your health needs are our priority.  As part of our continuing mission to provide you with exceptional heart care, we have created designated Provider Care Teams.  These Care Teams include your primary Cardiologist (physician) and Advanced Practice Providers (APPs -  Physician Assistants and Nurse Practitioners) who all work together to provide you with the care you need, when you need it. You will need a follow up appointment in:  2-3 months.  Please call our office 2 months in advance to schedule this appointment.  You may see Dr. Acie Fredrickson or one of the following Advanced Practice Providers on your designated Care Team: Richardson Dopp, PA-C [place], [place] . Daune Perch, NP

## 2019-05-18 ENCOUNTER — Telehealth (HOSPITAL_COMMUNITY): Payer: Self-pay | Admitting: *Deleted

## 2019-05-18 NOTE — Telephone Encounter (Signed)
Close encounter 

## 2019-05-19 ENCOUNTER — Other Ambulatory Visit (HOSPITAL_COMMUNITY)
Admission: RE | Admit: 2019-05-19 | Discharge: 2019-05-19 | Disposition: A | Discharge status: Home / Self Care | Payer: BC Managed Care – PPO | Source: Ambulatory Visit | Attending: Cardiovascular Disease | Admitting: Cardiovascular Disease

## 2019-05-19 DIAGNOSIS — Z01812 Encounter for preprocedural laboratory examination: Secondary | ICD-10-CM | POA: Insufficient documentation

## 2019-05-19 DIAGNOSIS — Z20828 Contact with and (suspected) exposure to other viral communicable diseases: Secondary | ICD-10-CM | POA: Diagnosis not present

## 2019-05-20 LAB — NOVEL CORONAVIRUS, NAA (HOSP ORDER, SEND-OUT TO REF LAB; TAT 18-24 HRS): SARS-CoV-2, NAA: NOT DETECTED

## 2019-05-23 ENCOUNTER — Other Ambulatory Visit: Payer: Self-pay

## 2019-05-23 ENCOUNTER — Ambulatory Visit (HOSPITAL_COMMUNITY)
Admission: RE | Admit: 2019-05-23 | Discharge: 2019-05-23 | Disposition: A | Discharge status: Home / Self Care | Payer: BC Managed Care – PPO | Source: Ambulatory Visit | Attending: Cardiovascular Disease | Admitting: Cardiovascular Disease

## 2019-05-23 DIAGNOSIS — R0789 Other chest pain: Secondary | ICD-10-CM

## 2019-05-23 DIAGNOSIS — R079 Chest pain, unspecified: Secondary | ICD-10-CM

## 2019-05-23 LAB — EXERCISE TOLERANCE TEST
Estimated workload: 14.4 METS
Exercise duration (min): 12 min
Exercise duration (sec): 34 s
MPHR: 161 {beats}/min
Peak HR: 173 {beats}/min
Percent HR: 107 %
RPE: 16
Rest HR: 74 {beats}/min

## 2019-05-24 ENCOUNTER — Telehealth: Payer: Self-pay

## 2019-05-24 MED ORDER — METOPROLOL SUCCINATE ER 25 MG PO TB24: 25.0000 mg | ORAL_TABLET | Freq: Two times a day (BID) | ORAL | 3 refills | Status: DC

## 2019-05-24 MED ORDER — NITROGLYCERIN 0.4 MG SL SUBL: 0.4000 mg | SUBLINGUAL_TABLET | SUBLINGUAL | 3 refills | Status: DC | PRN

## 2019-05-24 NOTE — Telephone Encounter (Signed)
Called and spoke to the patient. Patient states that he has already been informed of his stress test results and the need for his cath tomorrow with Dr. Irish Lack. Made patient aware that I have sent in RX for metoprolol 25 BID and SL NTG. Explained how to use NTG and reviewed ER precautions. Per Dr. Acie Fredrickson, patient had a COVID test done on 9/11 and has been quarantining since then and the cath lab will accept this result. Patient will also have labs and his EKG in the morning when he goes in. Post cath f/u appt made for 10/1 with Dr. Acie Fredrickson. Reviewed cath lab instructions below with the patient and he verbalized understanding and thanked me for the call.   [name], [name] [address] Dept: [phone] Loc: [place]  05/24/2019  You are scheduled for a Cardiac Catheterization on Thursday, September 17 with Dr. Larae Grooms.  1. Please arrive at the [facility] (Main Entrance A) at [facility]: [address] [place], [place] [address] at 10:00 AM (This time is two hours before your procedure to ensure your preparation). Free valet parking service is available.   Special note: Every effort is made to have your procedure done on time. Please understand that emergencies sometimes delay scheduled procedures.  2. Diet: Do not eat solid foods after midnight.  The patient may have clear liquids until 5am upon the day of the procedure.  3. Labs: Labs and EKG to be done in Short stay tomorrow morning. COVID test done on 9/11  4. Medication instructions in preparation for your procedure:   Contrast Allergy: No   On the morning of your procedure, take your Aspirin and any of your regular morning medicines. You may use sips of water.  5. Plan for one night stay--bring personal belongings. 6. Bring a current list of your medications and current  insurance cards. 7. You MUST have a responsible person to drive you home. 8. Someone MUST be with you the first 24 hours after you arrive home or your discharge will be delayed. 9. Please wear clothes that are easy to get on and off and wear slip-on shoes.  Thank you for allowing [place] to care for you!   -- [facility]

## 2019-05-24 NOTE — Progress Notes (Signed)
Spoke with patinet regarding heart cath, instructed to continue quarantine.  Arrive at Admitting at 10:00 for cath at 12:00.  Nothing to drink after midnight, ok to take meds tomorrow AM.

## 2019-05-24 NOTE — Telephone Encounter (Signed)
-----   Message from Thayer Headings, MD sent at 05/24/2019 10:20 AM EDT ----- Franco Collet,  Will you send in a script  for NTG 0.4 mg SL to [facility] in [place]  Also restarting Metoprolol 25, mg PO BID   He is getting cath for a positive stress test yesterday  I'm having Genene Churn schedule it from here  Thanks so much  Phiol

## 2019-05-25 ENCOUNTER — Encounter (HOSPITAL_COMMUNITY)
Admission: RE | Disposition: A | Discharge status: Home / Self Care | Payer: BC Managed Care – PPO | Source: Home / Self Care | Attending: Interventional Cardiology

## 2019-05-25 ENCOUNTER — Other Ambulatory Visit: Payer: Self-pay

## 2019-05-25 ENCOUNTER — Ambulatory Visit (HOSPITAL_COMMUNITY)
Admission: RE | Admit: 2019-05-25 | Discharge: 2019-05-25 | Disposition: A | Discharge status: Home / Self Care | Payer: BC Managed Care – PPO | Attending: Interventional Cardiology | Admitting: Interventional Cardiology

## 2019-05-25 DIAGNOSIS — Z8349 Family history of other endocrine, nutritional and metabolic diseases: Secondary | ICD-10-CM | POA: Insufficient documentation

## 2019-05-25 DIAGNOSIS — I10 Essential (primary) hypertension: Secondary | ICD-10-CM | POA: Diagnosis not present

## 2019-05-25 DIAGNOSIS — Z8249 Family history of ischemic heart disease and other diseases of the circulatory system: Secondary | ICD-10-CM | POA: Insufficient documentation

## 2019-05-25 DIAGNOSIS — Z880 Allergy status to penicillin: Secondary | ICD-10-CM | POA: Diagnosis not present

## 2019-05-25 DIAGNOSIS — E039 Hypothyroidism, unspecified: Secondary | ICD-10-CM | POA: Insufficient documentation

## 2019-05-25 DIAGNOSIS — R9439 Abnormal result of other cardiovascular function study: Secondary | ICD-10-CM | POA: Diagnosis not present

## 2019-05-25 DIAGNOSIS — Z79899 Other long term (current) drug therapy: Secondary | ICD-10-CM | POA: Insufficient documentation

## 2019-05-25 DIAGNOSIS — R0789 Other chest pain: Secondary | ICD-10-CM

## 2019-05-25 DIAGNOSIS — Z79890 Hormone replacement therapy (postmenopausal): Secondary | ICD-10-CM | POA: Insufficient documentation

## 2019-05-25 DIAGNOSIS — Z7982 Long term (current) use of aspirin: Secondary | ICD-10-CM | POA: Insufficient documentation

## 2019-05-25 DIAGNOSIS — I2510 Atherosclerotic heart disease of native coronary artery without angina pectoris: Secondary | ICD-10-CM | POA: Insufficient documentation

## 2019-05-25 DIAGNOSIS — Z881 Allergy status to other antibiotic agents status: Secondary | ICD-10-CM | POA: Diagnosis not present

## 2019-05-25 HISTORY — PX: LEFT HEART CATH AND CORONARY ANGIOGRAPHY: CATH118249

## 2019-05-25 LAB — CBC
HCT: 46.8 % (ref 39.0–52.0)
Hemoglobin: 15.7 g/dL (ref 13.0–17.0)
MCH: 30.8 pg (ref 26.0–34.0)
MCHC: 33.5 g/dL (ref 30.0–36.0)
MCV: 91.8 fL (ref 80.0–100.0)
Platelets: 244 10*3/uL (ref 150–400)
RBC: 5.1 MIL/uL (ref 4.22–5.81)
RDW: 13.1 % (ref 11.5–15.5)
WBC: 7.3 10*3/uL (ref 4.0–10.5)
nRBC: 0 % (ref 0.0–0.2)

## 2019-05-25 LAB — COMPREHENSIVE METABOLIC PANEL
ALT: 19 U/L (ref 0–44)
AST: 23 U/L (ref 15–41)
Albumin: 4.3 g/dL (ref 3.5–5.0)
Alkaline Phosphatase: 55 U/L (ref 38–126)
Anion gap: 8 (ref 5–15)
BUN: 11 mg/dL (ref 6–20)
CO2: 28 mmol/L (ref 22–32)
Calcium: 9.5 mg/dL (ref 8.9–10.3)
Chloride: 104 mmol/L (ref 98–111)
Creatinine, Ser: 0.93 mg/dL (ref 0.61–1.24)
GFR calc Af Amer: >60 mL/min (ref >60)
GFR calc non Af Amer: >60 mL/min (ref >60)
Glucose, Bld: 106 mg/dL — ABNORMAL HIGH (ref 70–99)
Potassium: 4.1 mmol/L (ref 3.5–5.1)
Sodium: 140 mmol/L (ref 135–145)
Total Bilirubin: 1.3 mg/dL — ABNORMAL HIGH (ref 0.3–1.2)
Total Protein: 7.2 g/dL (ref 6.5–8.1)

## 2019-05-25 LAB — LIPID PANEL
Cholesterol: 152 mg/dL (ref 0–200)
HDL: 46 mg/dL (ref >40)
LDL Cholesterol: 92 mg/dL (ref 0–99)
Total CHOL/HDL Ratio: 3.3 RATIO
Triglycerides: 71 mg/dL (ref <150)
VLDL: 14 mg/dL (ref 0–40)

## 2019-05-25 SURGERY — LEFT HEART CATH AND CORONARY ANGIOGRAPHY
Anesthesia: LOCAL

## 2019-05-25 MED ORDER — SODIUM CHLORIDE 0.9 % IV SOLN: 250.0000 mL | INTRAVENOUS | Status: DC | PRN

## 2019-05-25 MED ORDER — FENTANYL CITRATE (PF) 100 MCG/2ML IJ SOLN
INTRAMUSCULAR | Status: AC
  Filled 2019-05-25: qty 2

## 2019-05-25 MED ORDER — ASPIRIN 81 MG PO CHEW: 81.0000 mg | CHEWABLE_TABLET | ORAL | Status: DC

## 2019-05-25 MED ORDER — SODIUM CHLORIDE 0.9 % IV SOLN: INTRAVENOUS | Status: DC

## 2019-05-25 MED ORDER — LIDOCAINE HCL (PF) 1 % IJ SOLN
INTRAMUSCULAR | Status: DC | PRN
  Administered 2019-05-25: 2 mL

## 2019-05-25 MED ORDER — ACETAMINOPHEN 325 MG PO TABS: 650.0000 mg | ORAL_TABLET | ORAL | Status: DC | PRN

## 2019-05-25 MED ORDER — SODIUM CHLORIDE 0.9 % WEIGHT BASED INFUSION
3.0000 mL/kg/h | INTRAVENOUS | Status: DC
  Administered 2019-05-25: 3 mL/kg/h via INTRAVENOUS

## 2019-05-25 MED ORDER — MIDAZOLAM HCL 2 MG/2ML IJ SOLN
INTRAMUSCULAR | Status: DC | PRN
  Administered 2019-05-25: 2 mg via INTRAVENOUS

## 2019-05-25 MED ORDER — HEPARIN (PORCINE) IN NACL 1000-0.9 UT/500ML-% IV SOLN
INTRAVENOUS | Status: AC
  Filled 2019-05-25: qty 500

## 2019-05-25 MED ORDER — HEPARIN SODIUM (PORCINE) 1000 UNIT/ML IJ SOLN
INTRAMUSCULAR | Status: AC
  Filled 2019-05-25: qty 1

## 2019-05-25 MED ORDER — SODIUM CHLORIDE 0.9% FLUSH: 3.0000 mL | Freq: Two times a day (BID) | INTRAVENOUS | Status: DC

## 2019-05-25 MED ORDER — SODIUM CHLORIDE 0.9% FLUSH: 3.0000 mL | INTRAVENOUS | Status: DC | PRN

## 2019-05-25 MED ORDER — HYDRALAZINE HCL 20 MG/ML IJ SOLN: 10.0000 mg | INTRAMUSCULAR | Status: DC | PRN

## 2019-05-25 MED ORDER — VERAPAMIL HCL 2.5 MG/ML IV SOLN
INTRAVENOUS | Status: DC | PRN
  Administered 2019-05-25: 10 mL via INTRA_ARTERIAL

## 2019-05-25 MED ORDER — HEPARIN (PORCINE) IN NACL 1000-0.9 UT/500ML-% IV SOLN
INTRAVENOUS | Status: DC | PRN
  Administered 2019-05-25 (×2): 500 mL

## 2019-05-25 MED ORDER — LIDOCAINE HCL (PF) 1 % IJ SOLN
INTRAMUSCULAR | Status: AC
  Filled 2019-05-25: qty 30

## 2019-05-25 MED ORDER — FENTANYL CITRATE (PF) 100 MCG/2ML IJ SOLN
INTRAMUSCULAR | Status: DC | PRN
  Administered 2019-05-25: 50 ug via INTRAVENOUS

## 2019-05-25 MED ORDER — ONDANSETRON HCL 4 MG/2ML IJ SOLN: 4.0000 mg | Freq: Four times a day (QID) | INTRAMUSCULAR | Status: DC | PRN

## 2019-05-25 MED ORDER — SODIUM CHLORIDE 0.9 % WEIGHT BASED INFUSION: 1.0000 mL/kg/h | INTRAVENOUS | Status: DC

## 2019-05-25 MED ORDER — IOHEXOL 350 MG/ML SOLN
INTRAVENOUS | Status: DC | PRN
  Administered 2019-05-25: 50 mL via INTRA_ARTERIAL

## 2019-05-25 MED ORDER — HEPARIN SODIUM (PORCINE) 1000 UNIT/ML IJ SOLN
INTRAMUSCULAR | Status: DC | PRN
  Administered 2019-05-25: 4000 [IU] via INTRAVENOUS

## 2019-05-25 MED ORDER — LABETALOL HCL 5 MG/ML IV SOLN: 10.0000 mg | INTRAVENOUS | Status: DC | PRN

## 2019-05-25 MED ORDER — MIDAZOLAM HCL 2 MG/2ML IJ SOLN
INTRAMUSCULAR | Status: AC
  Filled 2019-05-25: qty 2

## 2019-05-25 MED ORDER — VERAPAMIL HCL 2.5 MG/ML IV SOLN
INTRAVENOUS | Status: AC
  Filled 2019-05-25: qty 2

## 2019-05-25 SURGICAL SUPPLY — 9 items

## 2019-05-25 NOTE — Progress Notes (Signed)
Discharge instructions reviewed with pt and his wife both voice understanding.  

## 2019-05-25 NOTE — H&P (Signed)
Cardiology Admission History and Physical:   Patient ID: Anthony Soto MRN: [id number]; DOB: March 20, 1961   Admission date: 05/25/2019  Primary Care Provider: Blane Ohara, MD Primary Cardiologist:  Jontez Redfield  Primary Electrophysiologist:  None   Chief Complaint:  Chest pain, positive stress test  Patient Profile:   Anthony Soto is a 58 y.o. male who was admitted today for cardiac catheterization after having a positive stress test.   History of Present Illness:   Anthony Soto is a 58 year old gentleman who saw him in the office on April 14, 2019.  He complained of some chest squeezing.  These episodes would last several minutes.  He exercises on a regular basis.  He also has a history of hypertension and has been on blood pressure medicines for the past 10 to 15 years.  He walks 3 miles a day 5 days a week.  Because of his squeezing chest pain we referred him for a Bruce protocol stress test.  During the stress test he walked for 12 minutes and 30 seconds and achieved target heart rate.  He did not have any specific chest pains but did have 2 mm of ST segment depression at peak exercise.  The ST segment depression resolved after a minute or so.  He did have some chest squeezing following the stress test.  We made sure that he is on aspirin.  We gave him a prescription for nitroglycerin.  I have asked him to restart his metoprolol and we have scheduled him for heart catheterization today.  Heart Pathway Score:     Past Medical History:  Diagnosis Date  . Headache(784.0)    sinus  . History of kidney stones   . Hypertension   . Hypothyroidism   . Seizures (HCC)    25 yrs. ago from stress inducted    Past Surgical History:  Procedure Laterality Date  . BACK SURGERY  2008  . BACK SURGERY  2003   upper Back  . nasal septoplasty       Medications Prior to Admission: Prior to Admission medications   Medication Sig Start Date End Date Taking? Authorizing Provider  aspirin  EC 81 MG tablet Take 81 mg by mouth daily.   Yes [provider]  Homeopathic Products (PROSACEA) GEL Apply 1 application topically daily as needed (rash on nose area).    Yes [provider]  levothyroxine (SYNTHROID) 75 MCG tablet Take 75 mcg by mouth daily. 03/01/19  Yes [provider]  losartan (COZAAR) 25 MG tablet Take 1 tablet (25 mg total) by mouth daily. 04/14/19  Yes Sheryle Vice, Deloris Ping, MD  metoprolol succinate (TOPROL-XL) 25 MG 24 hr tablet Take 1 tablet (25 mg total) by mouth 2 (two) times daily. 05/24/19  Yes Keefer Soulliere, Deloris Ping, MD  rosuvastatin (CRESTOR) 5 MG tablet Take 5 mg by mouth daily.   Yes [provider]  loratadine (CLARITIN) 10 MG tablet Take 10 mg by mouth daily as needed for allergies.     [provider]  nitroGLYCERIN (NITROSTAT) 0.4 MG SL tablet Place 1 tablet (0.4 mg total) under the tongue every 5 (five) minutes as needed for chest pain. 05/24/19   Rube Sanchez, Deloris Ping, MD     Allergies:    Allergies  Allergen Reactions  . Levaquin [Levofloxacin] Itching  . Penicillins Rash    Has patient had a PCN reaction causing immediate rash, facial/tongue/throat swelling, SOB or lightheadedness with hypotension: Yes Has patient had a PCN reaction causing severe rash  involving mucus membranes or skin necrosis: No Has patient had a PCN reaction that required hospitalization: No Has patient had a PCN reaction occurring within the last 10 years: No If all of the above answers are "NO", then may proceed with Cephalosporin use.     Social History:   Social History   Socioeconomic History  . Marital status: Married    Spouse name: Not on file  . Number of children: Not on file  . Years of education: Not on file  . Highest education level: Not on file  Occupational History  . Not on file  Social Needs  . Financial resource strain: Not on file  . Food insecurity    Worry: Not on file    Inability: Not on file  . Transportation needs     Medical: Not on file    Non-medical: Not on file  Tobacco Use  . Smoking status: Never Smoker  . Smokeless tobacco: Never Used  Substance and Sexual Activity  . Alcohol use: No  . Drug use: No  . Sexual activity: Not on file  Lifestyle  . Physical activity    Days per week: Not on file    Minutes per session: Not on file  . Stress: Not on file  Relationships  . Social Musicianconnections    Talks on phone: Not on file    Gets together: Not on file    Attends religious service: Not on file    Active member of club or organization: Not on file    Attends meetings of clubs or organizations: Not on file    Relationship status: Not on file  . Intimate partner violence    Fear of current or ex partner: Not on file    Emotionally abused: Not on file    Physically abused: Not on file    Forced sexual activity: Not on file  Other Topics Concern  . Not on file  Social History Narrative  . Not on file    Family History:   The patient's family history includes High blood pressure in his father; Hypothyroidism in his mother.    ROS:  Please see the history of present illness.  All other ROS reviewed and negative.     Physical Exam/Data:   Vitals:   05/25/19 1000  BP: (!) 141/95  Pulse: (!) 58  Resp: 18  Temp: 97.8 F (36.6 C)  TempSrc: Skin  SpO2: 98%  Weight: 77.1 kg  Height: 5\' 9"  (1.753 m)   No intake or output data in the 24 hours ending 05/25/19 1106 Last 3 Weights 05/25/2019 04/14/2019 05/07/2017  Weight (lbs) 170 lb 171 lb 8 oz 174 lb 14.4 oz  Weight (kg) 77.111 kg 77.792 kg 79.334 kg     Body mass index is 25.1 kg/m.  General:  Well nourished, well developed, in no acute distress HEENT: normal Lymph: no adenopathy Neck: no JVD Endocrine:  No thryomegaly Vascular: No carotid bruits; FA pulses 2+ bilaterally without bruits  Cardiac:  normal S1, S2; RRR; no murmur  Lungs:  clear to auscultation bilaterally, no wheezing, rhonchi or rales  Abd: soft, nontender, no  hepatomegaly  Ext: no edema Musculoskeletal:  No deformities, BUE and BLE strength normal and equal Skin: warm and dry  Neuro:  CNs 2-12 intact, no focal abnormalities noted Psych:  Normal affect    EKG: April 14, 2019: Normal sinus rhythm at 94.  No ST or T wave changes.  Relevant CV Studies: GXT on  September 15 was positive.  He had 2 mm of ST segment depression at peak exercise.  Laboratory Data:  High Sensitivity Troponin:  No results for input(s): TROPONINIHS in the last 720 hours.    Chemistry Recent Labs  Lab 05/25/19 1010  NA 140  K 4.1  CL 104  CO2 28  GLUCOSE 106*  BUN 11  CREATININE 0.93  CALCIUM 9.5  GFRNONAA >60  GFRAA >60  ANIONGAP 8    Recent Labs  Lab 05/25/19 1010  PROT 7.2  ALBUMIN 4.3  AST 23  ALT 19  ALKPHOS 55  BILITOT 1.3*   Hematology Recent Labs  Lab 05/25/19 1010  WBC 7.3  RBC 5.10  HGB 15.7  HCT 46.8  MCV 91.8  MCH 30.8  MCHC 33.5  RDW 13.1  PLT 244   BNPNo results for input(s): BNP, PROBNP in the last 168 hours.  DDimer No results for input(s): DDIMER in the last 168 hours.   Radiology/Studies:  No results found.  Assessment and Plan:   1. Probable coronary artery disease: Anthony Soto presents with episodes of chest squeezing.  These typically occur with stress but also have occurred with exercise.  He has a positive stress test which revealed 2 mm of ST segment depression at peak exercise.  Given his symptoms and his positive stress test and risk factors, we have scheduled him for heart catheterization.  We discussed the risks, benefits, options.  He understands and agrees to proceed.  We have started him on aspirin.  We have restarted his metoprolol 25 mg twice a day.  He continues on losartan.  2.  Hypertension: Blood pressure is improved.  He is exercising regularly.  3.  Question of hyperlipidemia: We will draw labs today.  They are currently pending.     Severity of Illness: The appropriate patient status for  this patient is OBSERVATION. Observation status is judged to be reasonable and necessary in order to provide the required intensity of service to ensure the patient's safety. The patient's presenting symptoms, physical exam findings, and initial radiographic and laboratory data in the context of their medical condition is felt to place them at decreased risk for further clinical deterioration. Furthermore, it is anticipated that the patient will be medically stable for discharge from the hospital within 2 midnights of admission. The following factors support the patient status of observation.   " The patient's presenting symptoms include  Chest squeezing . " The physical exam findings include  As above . " The initial radiographic and laboratory data are  As above. .     For questions or updates, please contact [place] Please consult [email or web address] for contact info under        Signed, Mertie Moores, MD  05/25/2019 11:06 AM

## 2019-05-25 NOTE — Progress Notes (Addendum)
Splint applied to right arm  Ambulated in hallway tol well

## 2019-05-25 NOTE — Interval H&P Note (Signed)
Cath Lab Visit (complete for each Cath Lab visit)  Clinical Evaluation Leading to the Procedure:   ACS: No.  Non-ACS:    Anginal Classification: CCS II  Anti-ischemic medical therapy: Minimal Therapy (1 class of medications)  Non-Invasive Test Results: Intermediate-risk stress test findings: cardiac mortality 1-3%/year  Prior CABG: No previous CABG      History and Physical Interval Note:  05/25/2019 11:55 AM  Anthony Soto  has presented today for surgery, with the diagnosis of abnormal stress test.  The various methods of treatment have been discussed with the patient and family. After consideration of risks, benefits and other options for treatment, the patient has consented to  Procedure(s): LEFT HEART CATH AND CORONARY ANGIOGRAPHY (N/A) as a surgical intervention.  The patient's history has been reviewed, patient examined, no change in status, stable for surgery.  I have reviewed the patient's chart and labs.  Questions were answered to the patient's satisfaction.     Larae Grooms

## 2019-05-25 NOTE — Discharge Instructions (Signed)
drink plenty of fluids Keep right arm at or above heart level   Radial Site Care  This sheet gives you information about how to care for yourself after your procedure. Your health care provider may also give you more specific instructions. If you have problems or questions, contact your health care provider. What can I expect after the procedure? After the procedure, it is common to have:  Bruising and tenderness at the catheter insertion area. Follow these instructions at home: Medicines  Take over-the-counter and prescription medicines only as told by your health care provider. Insertion site care  Follow instructions from your health care provider about how to take care of your insertion site. Make sure you: ? Wash your hands with soap and water before you change your bandage (dressing). If soap and water are not available, use hand sanitizer. ? Change your dressing as told by your health care provider. ? Leave stitches (sutures), skin glue, or adhesive strips in place. These skin closures may need to stay in place for 2 weeks or longer. If adhesive strip edges start to loosen and curl up, you may trim the loose edges. Do not remove adhesive strips completely unless your health care provider tells you to do that.  Check your insertion site every day for signs of infection. Check for: ? Redness, swelling, or pain. ? Fluid or blood. ? Pus or a bad smell. ? Warmth.  Do not take baths, swim, or use a hot tub until your health care provider approves.  You may shower 24-48 hours after the procedure, or as directed by your health care provider. ? Remove the dressing and gently wash the site with plain soap and water. ? Pat the area dry with a clean towel. ? Do not rub the site. That could cause bleeding.  Do not apply powder or lotion to the site. Activity   For 24 hours after the procedure, or as directed by your health care provider: ? Do not flex or bend the affected arm. ? Do  not push or pull heavy objects with the affected arm. ? Do not drive yourself home from the hospital or clinic. You may drive 24 hours after the procedure unless your health care provider tells you not to. ? Do not operate machinery or power tools.  Do not lift anything that is heavier than 10 lb (4.5 kg), or the limit that you are told, until your health care provider says that it is safe.  Ask your health care provider when it is okay to: ? Return to work or school. ? Resume usual physical activities or sports. ? Resume sexual activity. General instructions  If the catheter site starts to bleed, raise your arm and put firm pressure on the site. If the bleeding does not stop, get help right away. This is a medical emergency.  If you went home on the same day as your procedure, a responsible adult should be with you for the first 24 hours after you arrive home.  Keep all follow-up visits as told by your health care provider. This is important. Contact a health care provider if:  You have a fever.  You have redness, swelling, or yellow drainage around your insertion site. Get help right away if:  You have unusual pain at the radial site.  The catheter insertion area swells very fast.  The insertion area is bleeding, and the bleeding does not stop when you hold steady pressure on the area.  Your arm or  hand becomes pale, cool, tingly, or numb. These symptoms may represent a serious problem that is an emergency. Do not wait to see if the symptoms will go away. Get medical help right away. Call your local emergency services (911 in the [place] Do not drive yourself to the hospital. Summary  After the procedure, it is common to have bruising and tenderness at the site.  Follow instructions from your health care provider about how to take care of your radial site wound. Check the wound every day for signs of infection.  Do not lift anything that is heavier than 10 lb (4.5 kg), or the  limit that you are told, until your health care provider says that it is safe. This information is not intended to replace advice given to you by your health care provider. Make sure you discuss any questions you have with your health care provider. Document Released: 09/26/2010 Document Revised: 09/29/2017 Document Reviewed: 09/29/2017 Elsevier Patient Education  2020 [employer].

## 2019-05-26 ENCOUNTER — Encounter (HOSPITAL_COMMUNITY): Payer: Self-pay | Admitting: Interventional Cardiology

## 2019-05-30 ENCOUNTER — Telehealth (INDEPENDENT_AMBULATORY_CARE_PROVIDER_SITE_OTHER): Payer: Self-pay | Admitting: Cardiovascular Disease

## 2019-05-30 ENCOUNTER — Telehealth: Payer: Self-pay | Admitting: Cardiovascular Disease

## 2019-05-30 NOTE — Telephone Encounter (Signed)
Called Anthony Soto to see how he was doing  Doing better  Concerned about his BP going up with exercise  We discussed that this is normal physiology. Will increase his metoprolol to 50 mg BID  He has an appt in a week or so .

## 2019-05-31 NOTE — Progress Notes (Signed)
Erroneous encounter

## 2019-06-01 MED ORDER — METOPROLOL SUCCINATE ER 50 MG PO TB24: 50.0000 mg | ORAL_TABLET | Freq: Two times a day (BID) | ORAL | 11 refills | Status: DC

## 2019-06-01 NOTE — Addendum Note (Signed)
Addended by: Emmaline Life on: 06/01/2019 08:34 AM   Modules accepted: Orders

## 2019-06-07 ENCOUNTER — Encounter: Payer: Self-pay | Admitting: Cardiovascular Disease

## 2019-06-08 ENCOUNTER — Ambulatory Visit: Payer: BC Managed Care – PPO | Admitting: Cardiovascular Disease

## 2019-06-08 ENCOUNTER — Other Ambulatory Visit: Payer: Self-pay | Admitting: Nurse Practitioner

## 2019-06-08 ENCOUNTER — Other Ambulatory Visit: Payer: Self-pay

## 2019-06-08 ENCOUNTER — Encounter: Payer: Self-pay | Admitting: Cardiovascular Disease

## 2019-06-08 VITALS — BP 138/84 | HR 60 | Ht 69.0 in | Wt 174.1 lb

## 2019-06-08 DIAGNOSIS — R0989 Other specified symptoms and signs involving the circulatory and respiratory systems: Secondary | ICD-10-CM

## 2019-06-08 DIAGNOSIS — I10 Essential (primary) hypertension: Secondary | ICD-10-CM | POA: Diagnosis not present

## 2019-06-08 DIAGNOSIS — I2510 Atherosclerotic heart disease of native coronary artery without angina pectoris: Secondary | ICD-10-CM | POA: Diagnosis not present

## 2019-06-08 DIAGNOSIS — R9439 Abnormal result of other cardiovascular function study: Secondary | ICD-10-CM

## 2019-06-08 MED ORDER — ROSUVASTATIN CALCIUM 10 MG PO TABS: 10.0000 mg | ORAL_TABLET | Freq: Every day | ORAL | 3 refills | Status: DC

## 2019-06-08 NOTE — Progress Notes (Signed)
Cardiology Office Note:    Date:  06/08/2019   ID:  Anthony Soto, DOB 07-14-1961, MRN [id number]  PCP:  Blane Ohara, MD  Cardiologist:  [place]  Electrophysiologist:  None   Referring MD: Blane Ohara, MD   Chief Complaint  Patient presents with  . Abnormal ECG    Aug. 7, 2020    Anthony Soto is a 58 y.o. male who we were asked to see by Dr. Sedalia Muta for HTN with variable BP and variable HR.  Has had variable BP   Has random episodes of chest squeezing.   Not related to exercise,   Not related to twising . 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  He recently had a stress test which was abnormal.  We referred him for heart catheterization at that time.  I personally reviewed the films with Dr. Jiles Garter.  He is found to have very mild abnormality of the proximal LAD.  He had minimal irregularities of the RCA and left circumflex region.  His left ventricular systolic function was normal.  His LVEDP was 13. He was started on metoprol 50 mg a day ,  We have increased it to Toprol 100 mg a day now  Is exercising regularly ,    Rarely eats salt,  Avoids salt    Past Medical History:  Diagnosis Date  . Headache(784.0)    sinus  . History of kidney stones   . Hypertension   . Hypothyroidism   . Seizures (HCC)    25 yrs. ago from stress inducted    Past Surgical History:  Procedure Laterality Date  . BACK SURGERY  2008  . BACK SURGERY  2003   upper Back  . LEFT HEART CATH AND CORONARY ANGIOGRAPHY N/A 05/25/2019   Procedure: LEFT HEART CATH AND CORONARY ANGIOGRAPHY;  Surgeon: Corky Crafts, MD;  [facility]: [facility] INVASIVE CV LAB;  Service:  Cardiovascular;  Laterality: N/A;  . nasal septoplasty      Current Medications: Current Meds  Medication Sig  . aspirin EC 81 MG tablet Take 81 mg by mouth daily.  . Homeopathic Products (PROSACEA) GEL Apply 1 application topically daily as needed (rash on nose area).   Marland Kitchen levothyroxine (SYNTHROID) 75 MCG tablet Take 75 mcg by mouth daily.  Marland Kitchen loratadine (CLARITIN) 10 MG tablet Take 10 mg by mouth daily as needed for allergies.   Marland Kitchen losartan (COZAAR) 25 MG tablet Take 1 tablet (25 mg total) by mouth daily.  . metoprolol succinate (TOPROL-XL) 50 MG 24 hr tablet Take 100 mg by mouth 2 (two) times daily. Take with or immediately following a meal.  . nitroGLYCERIN (NITROSTAT) 0.4 MG SL tablet Place 1 tablet (0.4 mg total) under the tongue every 5 (five) minutes as needed for chest pain.  . [DISCONTINUED] rosuvastatin (CRESTOR) 5 MG tablet Take 5 mg by mouth daily.     Allergies:   Levaquin [levofloxacin] and Penicillins   Social History   Socioeconomic History  . Marital status: Married    Spouse name: Not on file  . Number of children:  Not on file  . Years of education: Not on file  . Highest education level: Not on file  Occupational History  . Not on file  Social Needs  . Financial resource strain: Not on file  . Food insecurity    Worry: Not on file    Inability: Not on file  . Transportation needs    Medical: Not on file    Non-medical: Not on file  Tobacco Use  . Smoking status: Never Smoker  . Smokeless tobacco: Never Used  Substance and Sexual Activity  . Alcohol use: No  . Drug use: No  . Sexual activity: Not on file  Lifestyle  . Physical activity    Days per week: Not on file    Minutes per session: Not on file  . Stress: Not on file  Relationships  . Social Musicianconnections    Talks on phone: Not on file    Gets together: Not on file    Attends religious service: Not on file    Active member of club or organization: Not on file    Attends meetings of clubs or  organizations: Not on file    Relationship status: Not on file  Other Topics Concern  . Not on file  Social History Narrative  . Not on file     Family History: The patient's family history includes High blood pressure in his father; Hypothyroidism in his mother.  ROS:   Please see the history of present illness.     All other systems reviewed and are negative.  EKGs/Labs/Other Studies Reviewed:    The following studies were reviewed today:   EKG:    Recent Labs: 05/25/2019: ALT 19; BUN 11; Creatinine, Ser 0.93; Hemoglobin 15.7; Platelets 244; Potassium 4.1; Sodium 140  Recent Lipid Panel    Component Value Date/Time   CHOL 152 05/25/2019 1023   TRIG 71 05/25/2019 1023   HDL 46 05/25/2019 1023   CHOLHDL 3.3 05/25/2019 1023   VLDL 14 05/25/2019 1023   LDLCALC 92 05/25/2019 1023    Physical Exam: Blood pressure 138/84, pulse 60, height 5\' 9"  (1.753 m), weight 174 lb 1.9 oz (79 kg), SpO2 99 %.  GEN:  Well nourished, well developed in no acute distress HEENT: Normal NECK: No JVD; No carotid bruits LYMPHATICS: No lymphadenopathy CARDIAC: RRR   RESPIRATORY:  Clear to auscultation without rales, wheezing or rhonchi  ABDOMEN: Soft, non-tender, non-distended MUSCULOSKELETAL:  No edema; No deformity, right radial cath site looks great. SKIN: Warm and dry NEUROLOGIC:  Alert and oriented x 3     ASSESSMENT:    1. Coronary artery disease involving native coronary artery of native heart without angina pectoris   2. Essential hypertension   3. Hyperdynamic circulation    PLAN:    In order of problems listed above:  1.  HTN:   Blood pressure seems well controlled.  He is on metoprolol succinate 100 mg a day.  Is also on low-dose losartan 25 mg a day.  He has a hyperdynamic LV and may have left ventricular hypertrophy.  I like to get an echocardiogram for further evaluation.  2.  BP variability:    3.   Coronary artery disease: He was found to have mild coronary artery  disease involving the proximal LAD and diffuse disease involving the RCA and left circumflex artery.  I do not think that this is causing any symptoms.  He does need to lower his LDL slightly.  We will increase his  rosuvastatin to 10 mg a day.  We will also get an echocardiogram for further evaluation given his coronary artery disease.    Medication Adjustments/Labs and Tests Ordered: Current medicines are reviewed at length with the patient today.  Concerns regarding medicines are outlined above.  Orders Placed This Encounter  Procedures  . Lipid Profile  . Basic Metabolic Panel (BMET)  . Hepatic function panel  . ECHOCARDIOGRAM COMPLETE   Meds ordered this encounter  Medications  . rosuvastatin (CRESTOR) 10 MG tablet    Sig: Take 1 tablet (10 mg total) by mouth daily.    Dispense:  90 tablet    Refill:  3    Patient Instructions  Medication Instructions:  Your physician has recommended you make the following change in your medication:  INCREASE Rosuvastatin (Crestor) to 10 mg once daily  If you need a refill on your cardiac medications before your next appointment, please call your pharmacy.    Lab work: Your physician recommends that you have lab work in 3 months at [facility] in [place] for cholesterol, liver panel, and basic metabolic panel  You will need to FAST for this appointment - nothing to eat or drink after midnight the night before except water.     Testing/Procedures: Your physician has requested that you have an echocardiogram. Echocardiography is a painless test that uses sound waves to create images of your heart. It provides your doctor with information about the size and shape of your heart and how well your heart's chambers and valves are working. This procedure takes approximately one hour. There are no restrictions for this procedure.    Follow-Up: At [facility], you and your health needs are our priority.  As part of our continuing mission to  provide you with exceptional heart care, we have created designated Provider Care Teams.  These Care Teams include your primary Cardiologist (physician) and Advanced Practice Providers (APPs -  Physician Assistants and Nurse Practitioners) who all work together to provide you with the care you need, when you need it. You will need a follow up appointment in:  1 years.  Please call our office 2 months in advance to schedule this appointment.  You may see Dr. Acie Fredrickson or one of the following Advanced Practice Providers on your designated Care Team: Richardson Dopp, PA-C [place], [place] . Daune Perch, NP       Signed, Mertie Moores, MD  06/08/2019 12:37 PM    [place]

## 2019-06-08 NOTE — Patient Instructions (Addendum)
Medication Instructions:  Your physician has recommended you make the following change in your medication:  INCREASE Rosuvastatin (Crestor) to 10 mg once daily  If you need a refill on your cardiac medications before your next appointment, please call your pharmacy.    Lab work: Your physician recommends that you have lab work in 3 months at [facility] in [place] for cholesterol, liver panel, and basic metabolic panel  You will need to FAST for this appointment - nothing to eat or drink after midnight the night before except water.     Testing/Procedures: Your physician has requested that you have an echocardiogram. Echocardiography is a painless test that uses sound waves to create images of your heart. It provides your doctor with information about the size and shape of your heart and how well your heart's chambers and valves are working. This procedure takes approximately one hour. There are no restrictions for this procedure.    Follow-Up: At [facility], you and your health needs are our priority.  As part of our continuing mission to provide you with exceptional heart care, we have created designated Provider Care Teams.  These Care Teams include your primary Cardiologist (physician) and Advanced Practice Providers (APPs -  Physician Assistants and Nurse Practitioners) who all work together to provide you with the care you need, when you need it. You will need a follow up appointment in:  1 years.  Please call our office 2 months in advance to schedule this appointment.  You may see Dr. Acie Fredrickson or one of the following Advanced Practice Providers on your designated Care Team: Richardson Dopp, PA-C [place], [place] . Daune Perch, NP

## 2019-06-19 DIAGNOSIS — C44329 Squamous cell carcinoma of skin of other parts of face: Secondary | ICD-10-CM | POA: Diagnosis not present

## 2019-06-19 DIAGNOSIS — L821 Other seborrheic keratosis: Secondary | ICD-10-CM | POA: Diagnosis not present

## 2019-07-04 ENCOUNTER — Ambulatory Visit (HOSPITAL_COMMUNITY): Payer: BC Managed Care – PPO | Attending: Cardiology

## 2019-07-04 ENCOUNTER — Other Ambulatory Visit: Payer: Self-pay

## 2019-07-04 DIAGNOSIS — R0989 Other specified symptoms and signs involving the circulatory and respiratory systems: Secondary | ICD-10-CM | POA: Insufficient documentation

## 2019-07-04 DIAGNOSIS — I10 Essential (primary) hypertension: Secondary | ICD-10-CM

## 2019-07-04 DIAGNOSIS — I2510 Atherosclerotic heart disease of native coronary artery without angina pectoris: Secondary | ICD-10-CM | POA: Diagnosis not present

## 2019-07-19 ENCOUNTER — Ambulatory Visit: Payer: BC Managed Care – PPO | Admitting: Cardiovascular Disease

## 2019-07-19 DIAGNOSIS — C44329 Squamous cell carcinoma of skin of other parts of face: Secondary | ICD-10-CM | POA: Diagnosis not present

## 2019-08-23 DIAGNOSIS — K635 Polyp of colon: Secondary | ICD-10-CM | POA: Diagnosis not present

## 2019-08-23 DIAGNOSIS — D123 Benign neoplasm of transverse colon: Secondary | ICD-10-CM | POA: Diagnosis not present

## 2019-08-23 DIAGNOSIS — Z86010 Personal history of colonic polyps: Secondary | ICD-10-CM | POA: Diagnosis not present

## 2019-08-31 IMAGING — RF DG C-ARM 61-120 MIN
1 series · 2 of 2 positions shown · non-contrast
Comparison: 03/04/2017 lumbar spine radiographs

CLINICAL DATA: L3-4 PLIF.

EXAM:
DG C-ARM 61-120 MIN; LUMBAR SPINE - 2-3 VIEW

[Series 1: run · 2 of 2 slices shown]
[im 1/2]
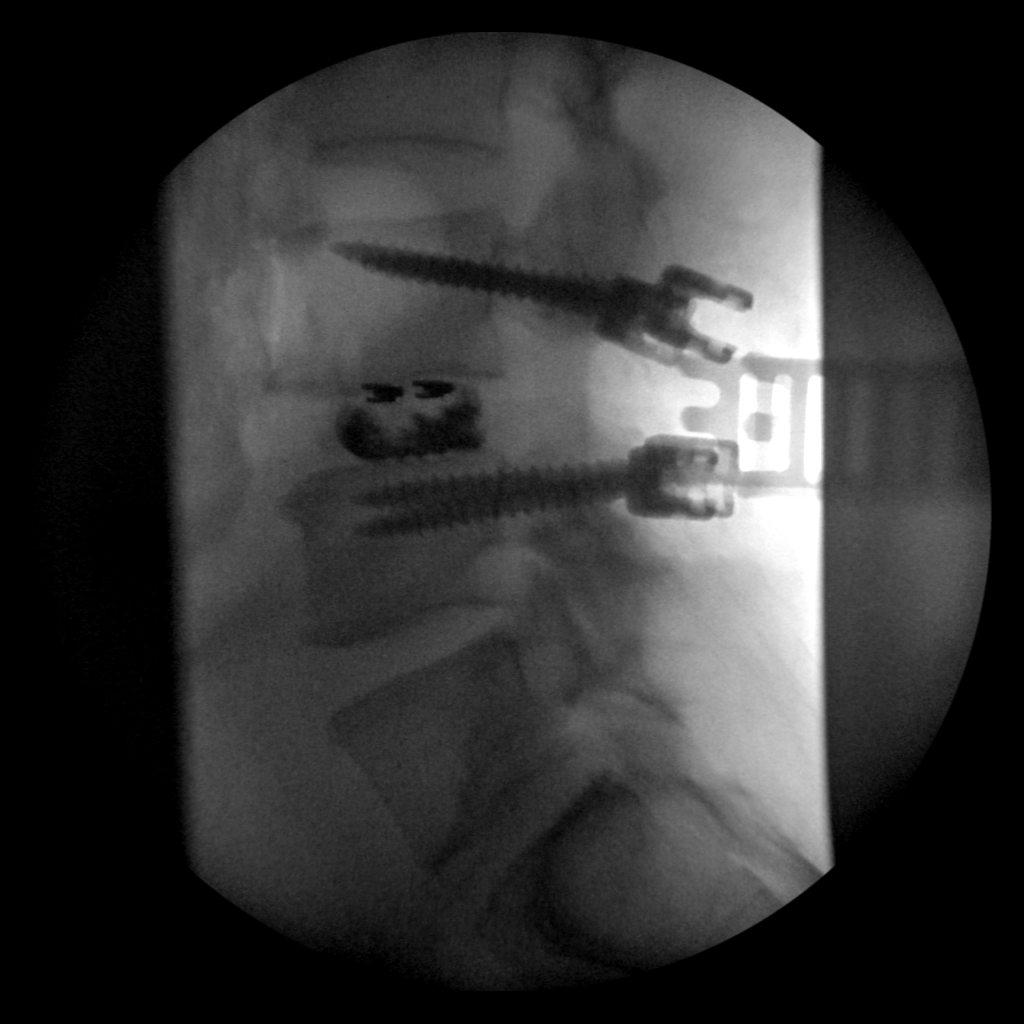
[im 2/2]
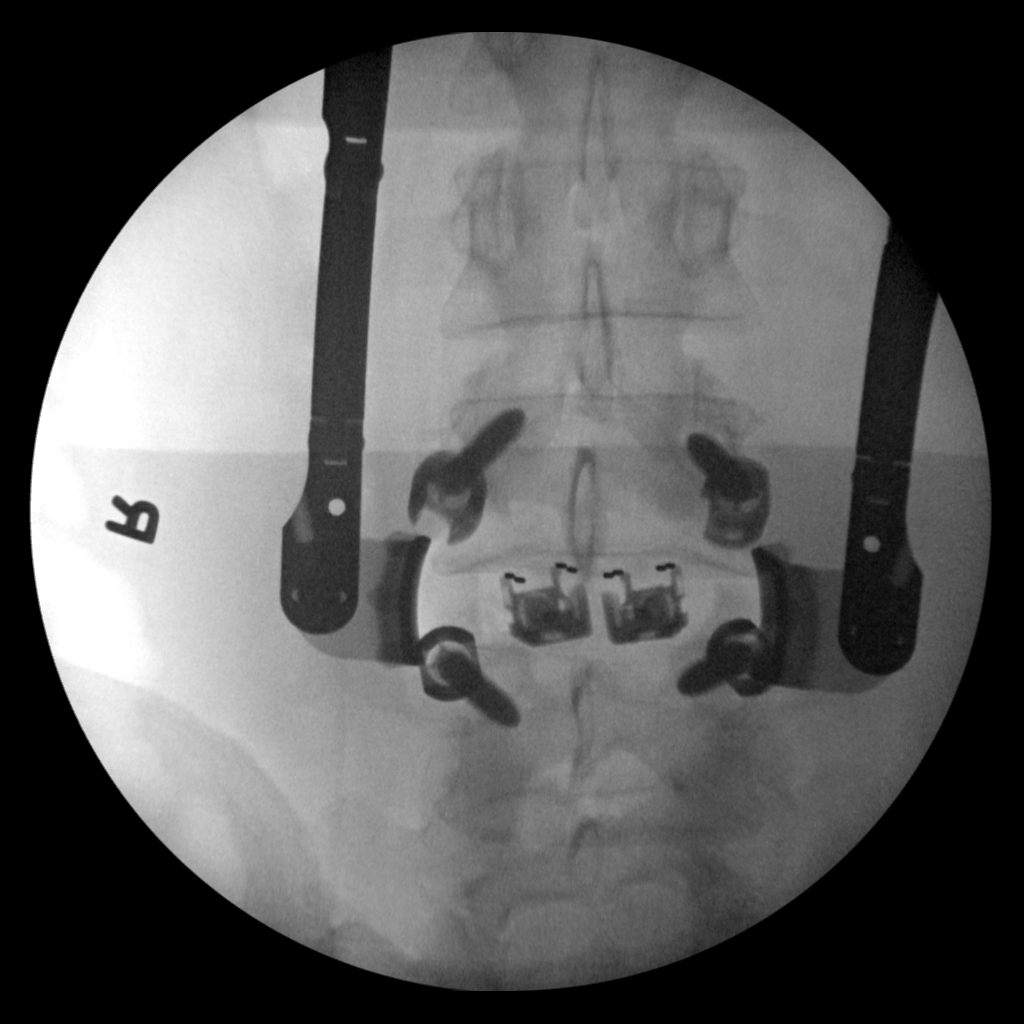

[2 of 2 positions shown; findings below may reference images not displayed]

FINDINGS: Fluoroscopy time 0 minutes 25 seconds. 2 spot fluoroscopic
nondiagnostic intraoperative lumbar spine radiographs demonstrate
postsurgical changes from bilateral posterior spinal fusion at L3-4
with bilateral pedicle screws in place at each level and with bone
cage in place in the L3-4 disc space.
IMPRESSION: Intraoperative fluoroscopic guidance for L3-4 PLIF.

## 2019-10-04 ENCOUNTER — Telehealth: Payer: Self-pay | Admitting: Cardiovascular Disease

## 2019-10-04 NOTE — Telephone Encounter (Signed)
I spoke to the patient who is calling because he is requesting that Dr Elease Hashimoto follow his heart medications.  He feels better him doing that and not a PA at his PCP office.    He is also asking if his 61 year old son can be seen as a New patient with Dr Elease Hashimoto.  His son was told by his PCP that he has heart issues and may need to wear a heart monitor, but the patient would rather have Dr Elease Hashimoto make that decision.  Please reach out to patient with advisement.

## 2019-10-04 NOTE — Telephone Encounter (Signed)
New Message:   Pt says he no longer have  A provider for his primary care. He have moved away. Pt wants to know if Dr Elease Hashimoto would monitor the medicine he was on from his primary doctor and prescribe them for him please?   He also would like to know if he would see his son as a new pateint please?

## 2019-10-05 NOTE — Telephone Encounter (Signed)
Left detailed message for patient that we can refill all of the medications except levothyroxine due to the monitoring that needs to go along with that medication. Advised him to call back to schedule an appointment for his son or with other questions or concerns.

## 2019-10-05 NOTE — Telephone Encounter (Signed)
Spoke with patient who verbalized understanding of information about medication refills. I also spoke with patient's son and scheduled an appointment with Dr. Elease Hashimoto for him. They thanked me for the call.

## 2019-10-09 DIAGNOSIS — Z7689 Persons encountering health services in other specified circumstances: Secondary | ICD-10-CM | POA: Diagnosis not present

## 2019-10-09 DIAGNOSIS — E039 Hypothyroidism, unspecified: Secondary | ICD-10-CM | POA: Diagnosis not present

## 2019-10-09 DIAGNOSIS — E782 Mixed hyperlipidemia: Secondary | ICD-10-CM | POA: Diagnosis not present

## 2019-10-09 DIAGNOSIS — E663 Overweight: Secondary | ICD-10-CM | POA: Diagnosis not present

## 2019-10-26 DIAGNOSIS — R5383 Other fatigue: Secondary | ICD-10-CM | POA: Diagnosis not present

## 2019-10-26 DIAGNOSIS — Z20828 Contact with and (suspected) exposure to other viral communicable diseases: Secondary | ICD-10-CM | POA: Diagnosis not present

## 2019-12-06 DIAGNOSIS — E039 Hypothyroidism, unspecified: Secondary | ICD-10-CM | POA: Diagnosis not present

## 2020-03-05 DIAGNOSIS — H5213 Myopia, bilateral: Secondary | ICD-10-CM | POA: Diagnosis not present

## 2020-04-04 DIAGNOSIS — R5383 Other fatigue: Secondary | ICD-10-CM | POA: Diagnosis not present

## 2020-04-04 DIAGNOSIS — R5381 Other malaise: Secondary | ICD-10-CM | POA: Diagnosis not present

## 2020-04-04 DIAGNOSIS — K529 Noninfective gastroenteritis and colitis, unspecified: Secondary | ICD-10-CM | POA: Diagnosis not present

## 2020-04-04 DIAGNOSIS — Z20828 Contact with and (suspected) exposure to other viral communicable diseases: Secondary | ICD-10-CM | POA: Diagnosis not present

## 2020-04-05 ENCOUNTER — Other Ambulatory Visit: Payer: Self-pay | Admitting: Cardiovascular Disease

## 2020-04-05 DIAGNOSIS — K529 Noninfective gastroenteritis and colitis, unspecified: Secondary | ICD-10-CM | POA: Diagnosis not present

## 2020-06-03 ENCOUNTER — Other Ambulatory Visit: Payer: Self-pay | Admitting: Cardiovascular Disease

## 2020-06-03 ENCOUNTER — Telehealth: Payer: Self-pay | Admitting: Interventional Cardiology

## 2020-06-03 MED ORDER — METOPROLOL SUCCINATE ER 50 MG PO TB24: ORAL_TABLET | ORAL | 0 refills | Status: DC

## 2020-06-03 NOTE — Telephone Encounter (Signed)
*  STAT* If patient is at the pharmacy, call can be transferred to refill team.   1. Which medications need to be refilled? (please list name of each medication and dose if known) metoprolol succinate (TOPROL-XL) 50 MG 24 hr tablet  2. Which pharmacy/location (including street and city if local pharmacy) is medication to be sent to? [facility] - [place], [place] - [address]  3. Do they need a 30 day or 90 day supply? 90

## 2020-06-03 NOTE — Telephone Encounter (Signed)
Pt's medication was sent to pt's pharmacy as a 30 day supply, because pt needs an appt with Dr. Elease Hashimoto for October. Confirmation received.

## 2020-07-02 ENCOUNTER — Other Ambulatory Visit: Payer: Self-pay | Admitting: Cardiovascular Disease

## 2020-07-23 DIAGNOSIS — L814 Other melanin hyperpigmentation: Secondary | ICD-10-CM | POA: Diagnosis not present

## 2020-07-23 DIAGNOSIS — L821 Other seborrheic keratosis: Secondary | ICD-10-CM | POA: Diagnosis not present

## 2020-07-23 DIAGNOSIS — L820 Inflamed seborrheic keratosis: Secondary | ICD-10-CM | POA: Diagnosis not present

## 2020-07-30 ENCOUNTER — Encounter: Payer: Self-pay | Admitting: Cardiovascular Disease

## 2020-07-30 ENCOUNTER — Ambulatory Visit: Payer: BLUE CROSS/BLUE SHIELD | Admitting: Cardiovascular Disease

## 2020-07-30 ENCOUNTER — Other Ambulatory Visit: Payer: Self-pay

## 2020-07-30 VITALS — BP 120/88 | HR 56 | Ht 69.0 in | Wt 185.8 lb

## 2020-07-30 DIAGNOSIS — I10 Essential (primary) hypertension: Secondary | ICD-10-CM | POA: Diagnosis not present

## 2020-07-30 DIAGNOSIS — I2510 Atherosclerotic heart disease of native coronary artery without angina pectoris: Secondary | ICD-10-CM

## 2020-07-30 DIAGNOSIS — E785 Hyperlipidemia, unspecified: Secondary | ICD-10-CM

## 2020-07-30 DIAGNOSIS — R002 Palpitations: Secondary | ICD-10-CM | POA: Diagnosis not present

## 2020-07-30 MED ORDER — LOSARTAN POTASSIUM 25 MG PO TABS
25.0000 mg | ORAL_TABLET | Freq: Every day | ORAL | 3 refills | Status: DC
Start: 2020-07-30 — End: 2022-09-18

## 2020-07-30 MED ORDER — METOPROLOL SUCCINATE ER 50 MG PO TB24: 50.0000 mg | ORAL_TABLET | Freq: Two times a day (BID) | ORAL | 3 refills | Status: DC

## 2020-07-30 MED ORDER — ROSUVASTATIN CALCIUM 10 MG PO TABS
10.0000 mg | ORAL_TABLET | Freq: Every day | ORAL | 3 refills | Status: DC
Start: 2020-07-30 — End: 2022-09-18

## 2020-07-30 NOTE — Patient Instructions (Signed)
Medication Instructions:  Your provider recommends that you continue on your current medications as directed. Please refer to the Current Medication list given to you today.   *If you need a refill on your cardiac medications before your next appointment, please call your pharmacy*  Follow-Up: At [facility], you and your health needs are our priority.  As part of our continuing mission to provide you with exceptional heart care, we have created designated Provider Care Teams.  These Care Teams include your primary Cardiologist (physician) and Advanced Practice Providers (APPs -  Physician Assistants and Nurse Practitioners) who all work together to provide you with the care you need, when you need it. Your next appointment:   12 month(s) The format for your next appointment:   In Person Provider:   You may see Philip Nahser, MD or one of the following Advanced Practice Providers on your designated Care Team:    Scott Weaver, PA-C  Vin Bhagat, PA-C   

## 2020-07-30 NOTE — Progress Notes (Signed)
Cardiology Office Note:    Date:  07/30/2020   ID:  Fritz Cauthon Camino, DOB 03/04/1961, MRN [id number]  PCP:  Blane Ohara, MD  Cardiologist:  Geoffrey Mankin  Electrophysiologist:  None   Referring MD: Blane Ohara, MD   Chief Complaint  Patient presents with  . Coronary Artery Disease    Aug. 7, 2020    LAJARVIS ITALIANO is a 59 y.o. male who we were asked to see by Dr. Sedalia Muta for HTN with variable BP and variable HR.  Has had variable BP   Has random episodes of chest squeezing.   Not related to exercise,   Not related to twising . 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  He recently had a stress test which was abnormal.  We referred him for heart catheterization at that time.  I personally reviewed the films with Dr. Eldridge Dace   He is found to have very mild abnormality of the proximal LAD.  He had minimal irregularities of the RCA and left circumflex region.  His left ventricular systolic function was normal.  His LVEDP was 13. He was started on metoprol 50 mg a day ,  We have increased it to Toprol 100 mg a day now  Is exercising regularly ,    Rarely eats salt,  Avoids salt   July 30, 2020: [name] is seen back today for follow-up of his mild coronary artery disease and hyperlipidemia.  He has a history of hypertension.  Exercising regularly ,  Staying active.   Has questions about family ,   Has made an appt for his mother in law to see me    Past Medical History:  Diagnosis Date  . Headache(784.0)    sinus  . History of kidney stones   . Hypertension   . Hypothyroidism   . Seizures (HCC)    25 yrs. ago from stress inducted    Past Surgical  History:  Procedure Laterality Date  . BACK SURGERY  2008  . BACK SURGERY  2003   upper Back  . LEFT HEART CATH AND CORONARY ANGIOGRAPHY N/A 05/25/2019   Procedure: LEFT HEART CATH AND CORONARY ANGIOGRAPHY;  Surgeon: Corky Crafts, MD;  Location: [facility] INVASIVE CV LAB;  Service: Cardiovascular;  Laterality: N/A;  . nasal septoplasty      Current Medications: Current Meds  Medication Sig  . aspirin EC 81 MG tablet Take 81 mg by mouth daily.  . Homeopathic Products (PROSACEA) GEL Apply 1 application topically daily as needed (rash on nose area).   Marland Kitchen levothyroxine (SYNTHROID) 75 MCG tablet Take 75 mcg by mouth daily.  Marland Kitchen loratadine (CLARITIN) 10 MG tablet Take 10 mg by mouth daily as needed for allergies.   Marland Kitchen losartan (COZAAR) 25 MG tablet Take 1 tablet (25 mg total) by mouth daily.  . metoprolol succinate (TOPROL-XL) 50 MG 24 hr tablet Take 1 tablet (50 mg total) by mouth in the morning and at bedtime.  . nitroGLYCERIN (NITROSTAT) 0.4 MG SL tablet Place 1 tablet (0.4 mg total) under the tongue every 5 (five) minutes as  needed for chest pain.  . rosuvastatin (CRESTOR) 10 MG tablet Take 1 tablet (10 mg total) by mouth daily.  . [DISCONTINUED] losartan (COZAAR) 25 MG tablet Take 1 tablet (25 mg total) by mouth daily. Please keep upcoming appt in November with Dr. Elease Hashimoto before anymore refills. Thank you  . [DISCONTINUED] metoprolol succinate (TOPROL-XL) 50 MG 24 hr tablet TAKE 1 TABLET BY MOUTH TWICE DAILY WITH OR IMMEDIATELY FOLLOWING A MEAL. Please keep upcoming appt with Dr. Elease Hashimoto in November before anymore refills. Thank you  . [DISCONTINUED] rosuvastatin (CRESTOR) 10 MG tablet Take 1 tablet (10 mg total) by mouth daily. Please keep upcoming appt in November with Dr. Elease Hashimoto before anymore refills. Thank you     Allergies:   Levaquin [levofloxacin] and Penicillins   Social History   Socioeconomic History  . Marital status: Married    Spouse name: Not on file  . Number of children:  Not on file  . Years of education: Not on file  . Highest education level: Not on file  Occupational History  . Not on file  Tobacco Use  . Smoking status: Never Smoker  . Smokeless tobacco: Never Used  Vaping Use  . Vaping Use: Never used  Substance and Sexual Activity  . Alcohol use: No  . Drug use: No  . Sexual activity: Not on file  Other Topics Concern  . Not on file  Social History Narrative  . Not on file   Social Determinants of Health   Financial Resource Strain:   . Difficulty of Paying Living Expenses: Not on file  Food Insecurity:   . Worried About Programme researcher, broadcasting/film/video in the Last Year: Not on file  . Ran Out of Food in the Last Year: Not on file  Transportation Needs:   . Lack of Transportation (Medical): Not on file  . Lack of Transportation (Non-Medical): Not on file  Physical Activity:   . Days of Exercise per Week: Not on file  . Minutes of Exercise per Session: Not on file  Stress:   . Feeling of Stress : Not on file  Social Connections:   . Frequency of Communication with Friends and Family: Not on file  . Frequency of Social Gatherings with Friends and Family: Not on file  . Attends Religious Services: Not on file  . Active Member of Clubs or Organizations: Not on file  . Attends Banker Meetings: Not on file  . Marital Status: Not on file     Family History: The patient's family history includes High blood pressure in his father; Hypothyroidism in his mother.  ROS:   Please see the history of present illness.     All other systems reviewed and are negative.  EKGs/Labs/Other Studies Reviewed:    The following studies were reviewed today:    Recent Labs: No results found for requested labs within last 8760 hours.  Recent Lipid Panel    Component Value Date/Time   CHOL 152 05/25/2019 1023   TRIG 71 05/25/2019 1023   HDL 46 05/25/2019 1023   CHOLHDL 3.3 05/25/2019 1023   VLDL 14 05/25/2019 1023   LDLCALC 92 05/25/2019 1023     Physical Exam: Blood pressure 120/88, pulse (!) 56, height 5\' 9"  (1.753 m), weight 185 lb 12.8 oz (84.3 kg), SpO2 98 %.  GEN:  Well nourished, well developed in no acute distress HEENT: Normal NECK: No JVD; No carotid bruits LYMPHATICS: No lymphadenopathy CARDIAC: RRR , no murmurs, rubs, gallops RESPIRATORY:  Clear to auscultation without rales, wheezing or rhonchi  ABDOMEN: Soft, non-tender, non-distended MUSCULOSKELETAL:  No edema; No deformity  SKIN: Warm and dry NEUROLOGIC:  Alert and oriented x 3  EKG:     Nov. 23, 2021:   Sinus brady at 56.  Some artifact.    ASSESSMENT:    1. Hyperlipidemia, unspecified hyperlipidemia type   2. Palpitations    PLAN:      1.  HTN:   His blood pressure has been well controlled.  Continue current medications.  He states that his blood pressure is normal at home.  He will continue to watch his salt.  2.  BP variability:    3.   Coronary artery disease: He was found to have mild coronary artery disease involving the proximal LAD and diffuse disease involving the RCA and left circumflex artery.  He denies having any episodes of angina.  His primary medical doctor has been managing his lipids   Medication Adjustments/Labs and Tests Ordered: Current medicines are reviewed at length with the patient today.  Concerns regarding medicines are outlined above.  Orders Placed This Encounter  Procedures  . EKG 12-Lead   Meds ordered this encounter  Medications  . metoprolol succinate (TOPROL-XL) 50 MG 24 hr tablet    Sig: Take 1 tablet (50 mg total) by mouth in the morning and at bedtime.    Dispense:  180 tablet    Refill:  3  . losartan (COZAAR) 25 MG tablet    Sig: Take 1 tablet (25 mg total) by mouth daily.    Dispense:  90 tablet    Refill:  3  . rosuvastatin (CRESTOR) 10 MG tablet    Sig: Take 1 tablet (10 mg total) by mouth daily.    Dispense:  90 tablet    Refill:  3    Patient Instructions  Medication Instructions:   Your provider recommends that you continue on your current medications as directed. Please refer to the Current Medication list given to you today.   *If you need a refill on your cardiac medications before your next appointment, please call your pharmacy*   Follow-Up: At [facility], you and your health needs are our priority.  As part of our continuing mission to provide you with exceptional heart care, we have created designated Provider Care Teams.  These Care Teams include your primary Cardiologist (physician) and Advanced Practice Providers (APPs -  Physician Assistants and Nurse Practitioners) who all work together to provide you with the care you need, when you need it. Your next appointment:   12 month(s) The format for your next appointment:   In Person Provider:   You may see Kristeen Miss, MD or one of the following Advanced Practice Providers on your designated Care Team:    Tereso Newcomer, PA-C  Chelsea Aus, [place]      Signed, Kristeen Miss, MD  07/30/2020 11:04 AM    [facility]

## 2020-12-16 DIAGNOSIS — J302 Other seasonal allergic rhinitis: Secondary | ICD-10-CM | POA: Diagnosis not present

## 2020-12-24 DIAGNOSIS — C4441 Basal cell carcinoma of skin of scalp and neck: Secondary | ICD-10-CM | POA: Diagnosis not present

## 2020-12-24 DIAGNOSIS — D1801 Hemangioma of skin and subcutaneous tissue: Secondary | ICD-10-CM | POA: Diagnosis not present

## 2020-12-24 DIAGNOSIS — L578 Other skin changes due to chronic exposure to nonionizing radiation: Secondary | ICD-10-CM | POA: Diagnosis not present

## 2020-12-24 DIAGNOSIS — L570 Actinic keratosis: Secondary | ICD-10-CM | POA: Diagnosis not present

## 2020-12-24 DIAGNOSIS — D225 Melanocytic nevi of trunk: Secondary | ICD-10-CM | POA: Diagnosis not present

## 2020-12-24 DIAGNOSIS — L814 Other melanin hyperpigmentation: Secondary | ICD-10-CM | POA: Diagnosis not present

## 2021-01-21 DIAGNOSIS — C4441 Basal cell carcinoma of skin of scalp and neck: Secondary | ICD-10-CM | POA: Diagnosis not present

## 2021-01-24 DIAGNOSIS — E782 Mixed hyperlipidemia: Secondary | ICD-10-CM | POA: Diagnosis not present

## 2021-01-24 DIAGNOSIS — Z6826 Body mass index (BMI) 26.0-26.9, adult: Secondary | ICD-10-CM | POA: Diagnosis not present

## 2021-01-24 DIAGNOSIS — Z125 Encounter for screening for malignant neoplasm of prostate: Secondary | ICD-10-CM | POA: Diagnosis not present

## 2021-01-24 DIAGNOSIS — E039 Hypothyroidism, unspecified: Secondary | ICD-10-CM | POA: Diagnosis not present

## 2021-01-24 DIAGNOSIS — R5383 Other fatigue: Secondary | ICD-10-CM | POA: Diagnosis not present

## 2021-01-24 DIAGNOSIS — Z79899 Other long term (current) drug therapy: Secondary | ICD-10-CM | POA: Diagnosis not present

## 2021-03-27 DIAGNOSIS — J069 Acute upper respiratory infection, unspecified: Secondary | ICD-10-CM | POA: Diagnosis not present

## 2021-03-27 DIAGNOSIS — Z20828 Contact with and (suspected) exposure to other viral communicable diseases: Secondary | ICD-10-CM | POA: Diagnosis not present

## 2021-07-30 ENCOUNTER — Encounter: Payer: Self-pay | Admitting: Cardiovascular Disease

## 2021-07-30 ENCOUNTER — Other Ambulatory Visit: Payer: Self-pay

## 2021-07-30 ENCOUNTER — Ambulatory Visit: Payer: BC Managed Care – PPO | Admitting: Cardiovascular Disease

## 2021-07-30 VITALS — BP 120/72 | HR 61 | Ht 69.0 in | Wt 184.8 lb

## 2021-07-30 DIAGNOSIS — I2510 Atherosclerotic heart disease of native coronary artery without angina pectoris: Secondary | ICD-10-CM

## 2021-07-30 DIAGNOSIS — I10 Essential (primary) hypertension: Secondary | ICD-10-CM

## 2021-07-30 DIAGNOSIS — E785 Hyperlipidemia, unspecified: Secondary | ICD-10-CM

## 2021-07-30 LAB — BASIC METABOLIC PANEL
BUN/Creatinine Ratio: 15 (ref 10–24)
BUN: 14 mg/dL (ref 8–27)
CO2: 26 mmol/L (ref 20–29)
Calcium: 9.7 mg/dL (ref 8.6–10.2)
Chloride: 100 mmol/L (ref 96–106)
Creatinine, Ser: 0.94 mg/dL (ref 0.76–1.27)
Glucose: 105 mg/dL — ABNORMAL HIGH (ref 70–99)
Potassium: 4.6 mmol/L (ref 3.5–5.2)
Sodium: 138 mmol/L (ref 134–144)
eGFR: 92 mL/min/{1.73_m2} (ref >59)

## 2021-07-30 LAB — LIPID PANEL
Chol/HDL Ratio: 3.2 ratio (ref 0.0–5.0)
Cholesterol, Total: 145 mg/dL (ref 100–199)
HDL: 45 mg/dL (ref >39)
LDL Chol Calc (NIH): 81 mg/dL (ref 0–99)
Triglycerides: 103 mg/dL (ref 0–149)
VLDL Cholesterol Cal: 19 mg/dL (ref 5–40)

## 2021-07-30 LAB — ALT: ALT: 23 IU/L (ref 0–44)

## 2021-07-30 NOTE — Progress Notes (Signed)
Cardiology Office Note:    Date:  08/01/2021   ID:  DECLIN RAJAN, DOB 01-22-1961, MRN [id number]  PCP:  Noni Saupe, MD  Cardiologist:  Evangaline Jou  Electrophysiologist:  None   Referring MD: Blane Ohara, MD   Chief Complaint  Patient presents with   Coronary Artery Disease        Hyperlipidemia    Aug. 7, 2020    Anthony Soto is a 60 y.o. male who we were asked to see by Dr. Sedalia Muta for HTN with variable BP and variable HR.  Has had variable BP   Has random episodes of chest squeezing.   Not related to exercise,   Not related to twising . 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  He recently had a stress test which was abnormal.  We referred him for heart catheterization at that time.  I personally reviewed the films with Dr. Eldridge Dace   He is found to have very mild abnormality of the proximal LAD.  He had minimal irregularities of the RCA and left circumflex region.  His left ventricular systolic function was normal.  His LVEDP was 13. He was started on metoprol 50 mg a day ,  We have increased it to Toprol 100 mg a day now  Is exercising regularly ,    Rarely eats salt,  Avoids salt   July 30, 2020: Anthony Soto is seen back today for follow-up of his mild coronary artery disease and hyperlipidemia.  He has a history of hypertension.  Exercising regularly ,  Staying active.   Has questions about family ,   Has made an appt for his mother in law to see me    Nov. 23, 2022: Anthony Soto is seen  Still working - insurance / real estate business.  Wants to retire in 3 years  Had a + stress test 2-3 years ago ,  hypertensive response Cath showed  minor irreg irreg.    Past Medical History:  Diagnosis Date   Headache(784.0)    sinus   History of kidney stones    Hypertension    Hypothyroidism    Seizures (HCC)    25 yrs. ago from stress inducted    Past Surgical History:  Procedure Laterality Date   BACK SURGERY  2008   BACK SURGERY  2003   upper Back   LEFT HEART CATH AND CORONARY ANGIOGRAPHY N/A 05/25/2019   Procedure: LEFT HEART CATH AND CORONARY ANGIOGRAPHY;  Surgeon: Corky Crafts, MD;  Location: [facility] INVASIVE CV LAB;  Service: Cardiovascular;  Laterality: N/A;   nasal septoplasty      Current Medications: Current Meds  Medication Sig   aspirin EC 81 MG tablet Take 81 mg by mouth daily.   Homeopathic Products (PROSACEA) GEL Apply 1 application topically daily as needed (rash on nose area).    levothyroxine (SYNTHROID) 75 MCG tablet Take 75 mcg by mouth daily.   loratadine (CLARITIN) 10 MG tablet Take 10 mg by mouth daily as needed for allergies.    losartan (COZAAR) 25 MG tablet Take 1  tablet (25 mg total) by mouth daily.   metoprolol succinate (TOPROL-XL) 50 MG 24 hr tablet Take 1 tablet (50 mg total) by mouth in the morning and at bedtime.   nitroGLYCERIN (NITROSTAT) 0.4 MG SL tablet Place 1 tablet (0.4 mg total) under the tongue every 5 (five) minutes as needed for chest pain.   rosuvastatin (CRESTOR) 10 MG tablet Take 1 tablet (10 mg total) by mouth daily.     Allergies:   Levaquin [levofloxacin] and Penicillins   Social History   Socioeconomic History   Marital status: Married    Spouse name: Not on file   Number of children: Not on file   Years of education: Not on file   Highest education level: Not on file  Occupational History   Not on file  Tobacco Use   Smoking status: Never   Smokeless tobacco: Never  Vaping Use   Vaping Use: Never used  Substance and Sexual Activity   Alcohol use: No   Drug use: No   Sexual activity: Not on file  Other Topics Concern   Not on file  Social History  Narrative   Not on file   Social Determinants of Health   Financial Resource Strain: Not on file  Food Insecurity: Not on file  Transportation Needs: Not on file  Physical Activity: Not on file  Stress: Not on file  Social Connections: Not on file     Family History: The patient's family history includes High blood pressure in his father; Hypothyroidism in his mother.  ROS:   Please see the history of present illness.     All other systems reviewed and are negative.  EKGs/Labs/Other Studies Reviewed:    The following studies were reviewed today:    Recent Labs: 07/30/2021: ALT 23; BUN 14; Creatinine, Ser 0.94; Potassium 4.6; Sodium 138  Recent Lipid Panel    Component Value Date/Time   CHOL 145 07/30/2021 1032   TRIG 103 07/30/2021 1032   HDL 45 07/30/2021 1032   CHOLHDL 3.2 07/30/2021 1032   CHOLHDL 3.3 05/25/2019 1023   VLDL 14 05/25/2019 1023   LDLCALC 81 07/30/2021 1032    Physical Exam: Blood pressure 120/72, pulse 61, height 5\' 9"  (1.753 m), weight 184 lb 12.8 oz (83.8 kg), SpO2 96 %.  GEN:  Well nourished, well developed in no acute distress HEENT: Normal NECK: No JVD; No carotid bruits LYMPHATICS: No lymphadenopathy CARDIAC: RRR , no murmurs, rubs, gallops RESPIRATORY:  Clear to auscultation without rales, wheezing or rhonchi  ABDOMEN: Soft, non-tender, non-distended MUSCULOSKELETAL:  No edema; No deformity  SKIN: Warm and dry NEUROLOGIC:  Alert and oriented x 3  EKG:     July 30, 2021: Normal sinus rhythm at 61.  No ST or T wave changes.   ASSESSMENT:    1. Hyperlipidemia, unspecified hyperlipidemia type   2. Coronary artery disease involving native coronary artery of native heart without angina pectoris   3. Primary hypertension     PLAN:       HTN:    Blood pressure is well controlled.  Continue current medications.  Hyperlipidemia: His last LDL is 92.  His LDL goal is between 50 and 70.  He is on rosuvastatin.  We will draw labs  today.  We will consider adding Zetia or perhaps referring him to the lipid clinic for the consideration of PCSK9 inhibitors or Inclarsarin    3.   Coronary artery disease:   Mild CAD , no angina  Medication  Adjustments/Labs and Tests Ordered: Current medicines are reviewed at length with the patient today.  Concerns regarding medicines are outlined above.  Orders Placed This Encounter  Procedures   Basic metabolic panel   ALT   Lipid panel   EKG 12-Lead    No orders of the defined types were placed in this encounter.    Patient Instructions  Medication Instructions:  Your physician recommends that you continue on your current medications as directed. Please refer to the Current Medication list given to you today.  *If you need a refill on your cardiac medications before your next appointment, please call your pharmacy*   Lab Work: BMET, Lipid and ALT today  If you have labs (blood work) drawn today and your tests are completely normal, you will receive your results only by: MyChart Message (if you have MyChart) OR A paper copy in the mail If you have any lab test that is abnormal or we need to change your treatment, we will call you to review the results.   Testing/Procedures: None   Follow-Up: At [facility], you and your health needs are our priority.  As part of our continuing mission to provide you with exceptional heart care, we have created designated Provider Care Teams.  These Care Teams include your primary Cardiologist (physician) and Advanced Practice Providers (APPs -  Physician Assistants and Nurse Practitioners) who all work together to provide you with the care you need, when you need it.  We recommend signing up for the patient portal called "MyChart".  Sign up information is provided on this After Visit Summary.  MyChart is used to connect with patients for Virtual Visits (Telemedicine).  Patients are able to view lab/test results, encounter notes,  upcoming appointments, etc.  Non-urgent messages can be sent to your provider as well.   To learn more about what you can do with MyChart, go to [email or web address].    Your next appointment:   1 year(s)  The format for your next appointment:   In Person  Provider:   Kristeen Miss, MD     Other Instructions   Ask Dr. Jeanie Sewer about adding one of the following for LDL lowering Your LDL goal is now 55.  Zetia 10 mg a day  Pralulent or Repatha ( injectable cholesterol meds )  Inclasarin      Signed, Kristeen Miss, MD  08/01/2021 8:19 AM    [facility]

## 2021-07-30 NOTE — Patient Instructions (Signed)
Medication Instructions:  Your physician recommends that you continue on your current medications as directed. Please refer to the Current Medication list given to you today.  *If you need a refill on your cardiac medications before your next appointment, please call your pharmacy*   Lab Work: BMET, Lipid and ALT today  If you have labs (blood work) drawn today and your tests are completely normal, you will receive your results only by: MyChart Message (if you have MyChart) OR A paper copy in the mail If you have any lab test that is abnormal or we need to change your treatment, we will call you to review the results.   Testing/Procedures: None   Follow-Up: At [facility], you and your health needs are our priority.  As part of our continuing mission to provide you with exceptional heart care, we have created designated Provider Care Teams.  These Care Teams include your primary Cardiologist (physician) and Advanced Practice Providers (APPs -  Physician Assistants and Nurse Practitioners) who all work together to provide you with the care you need, when you need it.  We recommend signing up for the patient portal called "MyChart".  Sign up information is provided on this After Visit Summary.  MyChart is used to connect with patients for Virtual Visits (Telemedicine).  Patients are able to view lab/test results, encounter notes, upcoming appointments, etc.  Non-urgent messages can be sent to your provider as well.   To learn more about what you can do with MyChart, go to [email or web address].    Your next appointment:   1 year(s)  The format for your next appointment:   In Person  Provider:   Kristeen Miss, MD     Other Instructions   Ask Dr. Jeanie Sewer about adding one of the following for LDL lowering Your LDL goal is now 55.  Zetia 10 mg a day  Pralulent or Repatha ( injectable cholesterol meds )  Inclasarin

## 2021-09-23 ENCOUNTER — Other Ambulatory Visit: Payer: Self-pay | Admitting: Cardiovascular Disease

## 2021-09-24 ENCOUNTER — Telehealth: Payer: Self-pay | Admitting: Cardiovascular Disease

## 2021-09-24 DIAGNOSIS — E785 Hyperlipidemia, unspecified: Secondary | ICD-10-CM | POA: Diagnosis not present

## 2021-09-24 NOTE — Telephone Encounter (Signed)
Pt c/o medication issue:  1. Name of Medication: metoprolol succinate (TOPROL-XL) 50 MG 24 hr tablet  2. How are you currently taking this medication (dosage and times per day)? TAKE 1 TABLET BY MOUTH IN THE MORNING AND AT BEDTIME  3. Are you having a reaction (difficulty breathing--STAT)? no  4. What is your medication issue? Patient calling in to say the he pharmacy got two different instruction for this medication. He stats Dr. Melburn Popper wrote one and so did his pcp. Just need clarification. Please advise

## 2021-09-24 NOTE — Telephone Encounter (Signed)
Reviewed w/ pt. Pt reports that PCP, Dr. Janace Aris office sent in Rx for Lopressor but pt is confused because he thought he was taking Toprol. Confirmed w/ pt that we prescribed Toprol 50 mg BID and that he has been on this regimen for several years now.   Advised that our office did not change this Rx.  Pt wanted to confirm with [place] which Rx he should be taking, or which Rx we started/prescribed. Made pt aware that our office sent in refill request yesterday. Pt will let pharmacy know to fill the Toprol and will address Lopressor Rx w/ PCP. Patient appreciates the follow call and information.

## 2021-09-29 ENCOUNTER — Telehealth: Payer: Self-pay | Admitting: Cardiovascular Disease

## 2021-09-29 NOTE — Telephone Encounter (Signed)
Pt c/o medication issue:  1. Name of Medication: Ezetimibe 10 mg  2. How are you currently taking this medication (dosage and times per day)? As directed  3. Are you having a reaction (difficulty breathing--STAT)? no  4. What is your medication issue? Patient is not sure if he is to continue taking this medication or not.   He had labs drawn at his PCP Dr. Orlinda Blalock at [facility] 09/24/21. His PCP has not called regarding those lab results so he is not sure what medication to take.  Please send any new rx to [facility] - [place], [place] - [address]

## 2021-09-29 NOTE — Telephone Encounter (Signed)
Returned call to Pt.  Need to obtain lab work from PCP.  Per Pt-would prefer Dr. Elease Hashimoto manage his lipids.  Will follow up based on lab work.

## 2021-10-01 NOTE — Telephone Encounter (Signed)
Called patient back. Patient stated he received refills on his zetia and he is good.

## 2021-10-21 DIAGNOSIS — Z6826 Body mass index (BMI) 26.0-26.9, adult: Secondary | ICD-10-CM | POA: Diagnosis not present

## 2021-10-21 DIAGNOSIS — J302 Other seasonal allergic rhinitis: Secondary | ICD-10-CM | POA: Diagnosis not present

## 2021-12-10 DIAGNOSIS — N200 Calculus of kidney: Secondary | ICD-10-CM | POA: Diagnosis not present

## 2021-12-10 DIAGNOSIS — R140 Abdominal distension (gaseous): Secondary | ICD-10-CM | POA: Diagnosis not present

## 2022-01-02 DIAGNOSIS — D2239 Melanocytic nevi of other parts of face: Secondary | ICD-10-CM | POA: Diagnosis not present

## 2022-01-02 DIAGNOSIS — L570 Actinic keratosis: Secondary | ICD-10-CM | POA: Diagnosis not present

## 2022-01-02 DIAGNOSIS — D225 Melanocytic nevi of trunk: Secondary | ICD-10-CM | POA: Diagnosis not present

## 2022-01-02 DIAGNOSIS — D485 Neoplasm of uncertain behavior of skin: Secondary | ICD-10-CM | POA: Diagnosis not present

## 2022-01-02 DIAGNOSIS — L821 Other seborrheic keratosis: Secondary | ICD-10-CM | POA: Diagnosis not present

## 2022-01-02 DIAGNOSIS — L820 Inflamed seborrheic keratosis: Secondary | ICD-10-CM | POA: Diagnosis not present

## 2022-02-11 DIAGNOSIS — Z6826 Body mass index (BMI) 26.0-26.9, adult: Secondary | ICD-10-CM | POA: Diagnosis not present

## 2022-02-11 DIAGNOSIS — J019 Acute sinusitis, unspecified: Secondary | ICD-10-CM | POA: Diagnosis not present

## 2022-03-31 DIAGNOSIS — R430 Anosmia: Secondary | ICD-10-CM | POA: Diagnosis not present

## 2022-04-10 DIAGNOSIS — J328 Other chronic sinusitis: Secondary | ICD-10-CM | POA: Diagnosis not present

## 2022-04-28 DIAGNOSIS — J324 Chronic pansinusitis: Secondary | ICD-10-CM | POA: Diagnosis not present

## 2022-05-25 DIAGNOSIS — H5213 Myopia, bilateral: Secondary | ICD-10-CM | POA: Diagnosis not present

## 2022-06-24 DIAGNOSIS — E785 Hyperlipidemia, unspecified: Secondary | ICD-10-CM | POA: Diagnosis not present

## 2022-06-24 DIAGNOSIS — I639 Cerebral infarction, unspecified: Secondary | ICD-10-CM | POA: Diagnosis not present

## 2022-06-24 DIAGNOSIS — E860 Dehydration: Secondary | ICD-10-CM | POA: Diagnosis not present

## 2022-06-24 DIAGNOSIS — R61 Generalized hyperhidrosis: Secondary | ICD-10-CM | POA: Diagnosis not present

## 2022-06-24 DIAGNOSIS — R55 Syncope and collapse: Secondary | ICD-10-CM | POA: Diagnosis not present

## 2022-06-24 DIAGNOSIS — Z23 Encounter for immunization: Secondary | ICD-10-CM | POA: Diagnosis not present

## 2022-06-24 DIAGNOSIS — I959 Hypotension, unspecified: Secondary | ICD-10-CM | POA: Diagnosis not present

## 2022-06-24 DIAGNOSIS — S61412A Laceration without foreign body of left hand, initial encounter: Secondary | ICD-10-CM | POA: Diagnosis not present

## 2022-06-24 DIAGNOSIS — I10 Essential (primary) hypertension: Secondary | ICD-10-CM | POA: Diagnosis not present

## 2022-06-24 DIAGNOSIS — E039 Hypothyroidism, unspecified: Secondary | ICD-10-CM | POA: Diagnosis not present

## 2022-06-24 DIAGNOSIS — Z79899 Other long term (current) drug therapy: Secondary | ICD-10-CM | POA: Diagnosis not present

## 2022-06-24 DIAGNOSIS — R569 Unspecified convulsions: Secondary | ICD-10-CM | POA: Diagnosis not present

## 2022-06-24 DIAGNOSIS — Z8673 Personal history of transient ischemic attack (TIA), and cerebral infarction without residual deficits: Secondary | ICD-10-CM | POA: Diagnosis not present

## 2022-06-24 DIAGNOSIS — Z7982 Long term (current) use of aspirin: Secondary | ICD-10-CM | POA: Diagnosis not present

## 2022-06-24 DIAGNOSIS — W540XXA Bitten by dog, initial encounter: Secondary | ICD-10-CM | POA: Diagnosis not present

## 2022-06-24 DIAGNOSIS — R9431 Abnormal electrocardiogram [ECG] [EKG]: Secondary | ICD-10-CM | POA: Diagnosis not present

## 2022-06-24 DIAGNOSIS — G4089 Other seizures: Secondary | ICD-10-CM | POA: Diagnosis not present

## 2022-06-24 DIAGNOSIS — Z87442 Personal history of urinary calculi: Secondary | ICD-10-CM | POA: Diagnosis not present

## 2022-06-24 DIAGNOSIS — S61422A Laceration with foreign body of left hand, initial encounter: Secondary | ICD-10-CM | POA: Diagnosis not present

## 2022-06-25 DIAGNOSIS — Z79899 Other long term (current) drug therapy: Secondary | ICD-10-CM | POA: Diagnosis not present

## 2022-06-25 DIAGNOSIS — R569 Unspecified convulsions: Secondary | ICD-10-CM | POA: Diagnosis not present

## 2022-06-25 DIAGNOSIS — Z7982 Long term (current) use of aspirin: Secondary | ICD-10-CM | POA: Diagnosis not present

## 2022-06-25 DIAGNOSIS — G4089 Other seizures: Secondary | ICD-10-CM | POA: Diagnosis not present

## 2022-06-25 DIAGNOSIS — R55 Syncope and collapse: Secondary | ICD-10-CM | POA: Diagnosis not present

## 2022-06-30 DIAGNOSIS — M24541 Contracture, right hand: Secondary | ICD-10-CM | POA: Diagnosis not present

## 2022-06-30 DIAGNOSIS — M20011 Mallet finger of right finger(s): Secondary | ICD-10-CM | POA: Diagnosis not present

## 2022-06-30 DIAGNOSIS — M79644 Pain in right finger(s): Secondary | ICD-10-CM | POA: Diagnosis not present

## 2022-06-30 DIAGNOSIS — M7989 Other specified soft tissue disorders: Secondary | ICD-10-CM | POA: Diagnosis not present

## 2022-07-02 DIAGNOSIS — M20011 Mallet finger of right finger(s): Secondary | ICD-10-CM | POA: Diagnosis not present

## 2022-07-03 DIAGNOSIS — L570 Actinic keratosis: Secondary | ICD-10-CM | POA: Diagnosis not present

## 2022-07-03 DIAGNOSIS — D2239 Melanocytic nevi of other parts of face: Secondary | ICD-10-CM | POA: Diagnosis not present

## 2022-07-03 DIAGNOSIS — D225 Melanocytic nevi of trunk: Secondary | ICD-10-CM | POA: Diagnosis not present

## 2022-07-24 DIAGNOSIS — M20011 Mallet finger of right finger(s): Secondary | ICD-10-CM | POA: Diagnosis not present

## 2022-08-17 ENCOUNTER — Encounter: Payer: Self-pay | Admitting: Cardiovascular Disease

## 2022-08-17 ENCOUNTER — Ambulatory Visit: Payer: BC Managed Care – PPO | Attending: Cardiovascular Disease | Admitting: Cardiovascular Disease

## 2022-08-17 VITALS — BP 130/80 | HR 55 | Ht 69.0 in | Wt 181.0 lb

## 2022-08-17 DIAGNOSIS — I10 Essential (primary) hypertension: Secondary | ICD-10-CM

## 2022-08-17 DIAGNOSIS — I2510 Atherosclerotic heart disease of native coronary artery without angina pectoris: Secondary | ICD-10-CM | POA: Diagnosis not present

## 2022-08-17 DIAGNOSIS — E785 Hyperlipidemia, unspecified: Secondary | ICD-10-CM | POA: Diagnosis not present

## 2022-08-17 NOTE — Patient Instructions (Signed)
Increase your intake of fluids (V8 juice,  water with electrolyte supplements like Liquid IV,   Nuun tablets, ) , protein ( hard boiled eggs, chicken, fish)   Medication Instructions:  STOP Aspirin *If you need a refill on your cardiac medications before your next appointment, please call your pharmacy*   Lab Work: Lipids, ALT, BMET in 1 month If you have labs (blood work) drawn today and your tests are completely normal, you will receive your results only by: MyChart Message (if you have MyChart) OR A paper copy in the mail If you have any lab test that is abnormal or we need to change your treatment, we will call you to review the results.   Testing/Procedures: NONE   Follow-Up: At [facility], you and your health needs are our priority.  As part of our continuing mission to provide you with exceptional heart care, we have created designated Provider Care Teams.  These Care Teams include your primary Cardiologist (physician) and Advanced Practice Providers (APPs -  Physician Assistants and Nurse Practitioners) who all work together to provide you with the care you need, when you need it.  We recommend signing up for the patient portal called "MyChart".  Sign up information is provided on this After Visit Summary.  MyChart is used to connect with patients for Virtual Visits (Telemedicine).  Patients are able to view lab/test results, encounter notes, upcoming appointments, etc.  Non-urgent messages can be sent to your provider as well.   To learn more about what you can do with MyChart, go to [email or web address].    Your next appointment:   1 year(s)  The format for your next appointment:   In Person  Provider:   Kristeen Miss, MD       Important Information About Sugar

## 2022-08-17 NOTE — Progress Notes (Signed)
Cardiology Office Note:    Date:  08/17/2022   ID:  Anthony Soto, DOB 02/26/1961, MRN [id number]  PCP:  Noni Saupe, MD  Cardiologist:  Loxley Schmale  Electrophysiologist:  None   Referring MD: Noni Saupe, MD   Chief Complaint  Patient presents with   Coronary Artery Disease   Hyperlipidemia    Aug. 7, 2020    Anthony Soto is a 61 y.o. male who we were asked to see by Dr. Sedalia Muta for HTN with variable BP and variable HR.  Has had variable BP   Has random episodes of chest squeezing.   Not related to exercise,   Not related to twising . 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  He recently had a stress test which was abnormal.  We referred him for heart catheterization at that time.  I personally reviewed the films with Dr. Eldridge Dace   He is found to have very mild abnormality of the proximal LAD.  He had minimal irregularities of the RCA and left circumflex region.  His left ventricular systolic function was normal.  His LVEDP was 13. He was started on metoprol 50 mg a day ,  We have increased it to Toprol 100 mg a day now  Is exercising regularly ,    Rarely eats salt,  Avoids salt   July 30, 2020: Anthony Soto is seen back today for follow-up of his mild coronary artery disease and hyperlipidemia.  He has a history of hypertension.  Exercising regularly ,  Staying active.   Has questions about family ,   Has made an appt for his mother in law to see me    Nov. 23, 2022: Anthony Soto is seen  Still working - insurance / real estate business.  Wants to retire in 3 years  Had a + stress test 2-3 years ago ,  hypertensive response Cath showed  minor irreg irreg.    Dec. 11, 2023  Anthony Soto seen today for follow-up visit.  He has a history of moderate coronary artery irregularities.  Has a history of hypertension and hyperlipidemia.  Had a vaso vagel episode  - initialted by a dog bite while he was playing with his dog .   Has been walking 2-3 miles a day , runs 3-4 miles several times a week.   Is dealing with aging parents    Past Medical History:  Diagnosis Date   Headache(784.0)    sinus   History of kidney stones    Hypertension    Hypothyroidism    Seizures (HCC)    25 yrs. ago from stress inducted    Past Surgical History:  Procedure Laterality Date   BACK SURGERY  2008   BACK SURGERY  2003   upper Back   LEFT HEART CATH AND CORONARY ANGIOGRAPHY N/A 05/25/2019   Procedure: LEFT HEART CATH AND CORONARY ANGIOGRAPHY;  Surgeon: Corky Crafts, MD;  Location: [facility] INVASIVE CV LAB;  Service: Cardiovascular;  Laterality: N/A;   nasal septoplasty      Current Medications: Current Meds  Medication Sig   ezetimibe (ZETIA) 10 MG tablet Take 10 mg by mouth daily.   Homeopathic Products (PROSACEA) GEL Apply 1 application topically daily as needed (rash on nose area).    levothyroxine (SYNTHROID) 75 MCG tablet Take 75 mcg by mouth daily.   loratadine (CLARITIN) 10 MG tablet Take 10 mg by mouth daily as needed for allergies.    losartan (COZAAR) 25 MG tablet Take 1 tablet (25 mg total) by mouth daily.   metoprolol succinate (TOPROL-XL) 50 MG 24 hr tablet TAKE 1 TABLET BY MOUTH IN THE MORNING AND AT BEDTIME   nitroGLYCERIN (NITROSTAT) 0.4 MG SL tablet Place 1 tablet (0.4 mg total) under the tongue every 5 (five) minutes as needed for chest pain.   rosuvastatin (CRESTOR) 10 MG tablet Take 1 tablet (10 mg total) by mouth daily.   [DISCONTINUED] aspirin EC 81 MG tablet Take 81 mg by mouth daily.     Allergies:   Levaquin [levofloxacin] and Penicillins   Social History   Socioeconomic History   Marital status: Married     Spouse name: Not on file   Number of children: Not on file   Years of education: Not on file   Highest education level: Not on file  Occupational History   Not on file  Tobacco Use   Smoking status: Never   Smokeless tobacco: Never  Vaping Use   Vaping Use: Never used  Substance and Sexual Activity   Alcohol use: No   Drug use: No   Sexual activity: Not on file  Other Topics Concern   Not on file  Social History Narrative   Not on file   Social Determinants of Health   Financial Resource Strain: Not on file  Food Insecurity: Not on file  Transportation Needs: Not on file  Physical Activity: Not on file  Stress: Not on file  Social Connections: Not on file     Family History: The patient's family history includes High blood pressure in his father; Hypothyroidism in his mother.  ROS:   Please see the history of present illness.     All other systems reviewed and are negative.  EKGs/Labs/Other Studies Reviewed:    The following studies were reviewed today:    Recent Labs: No results found for requested labs within last 365 days.  Recent Lipid Panel    Component Value Date/Time   CHOL 145 07/30/2021 1032   TRIG 103 07/30/2021 1032   HDL 45 07/30/2021 1032   CHOLHDL 3.2 07/30/2021 1032   CHOLHDL 3.3 05/25/2019 1023   VLDL 14 05/25/2019 1023   LDLCALC 81 07/30/2021 1032    Physical Exam: Blood pressure 130/80, pulse (!) 55, height 5\' 9"  (1.753 m), weight 181 lb (82.1 kg), SpO2 98 %.      GEN:  Well nourished, well developed in no acute distress HEENT: Normal NECK: No JVD; No carotid bruits LYMPHATICS: No lymphadenopathy CARDIAC: RRR , no murmurs, rubs, gallops RESPIRATORY:  Clear to auscultation without rales, wheezing or rhonchi  ABDOMEN: Soft, non-tender, non-distended MUSCULOSKELETAL:  No edema; No deformity  SKIN: Warm and dry NEUROLOGIC:  Alert and oriented x 3   EKG:      Dec. 11, 2023:   sinus bradycardia 55.  Normal ecg      ASSESSMENT:    1. Hyperlipidemia, unspecified hyperlipidemia type   2. Primary hypertension   3. Coronary artery disease involving native coronary artery of native heart without angina pectoris      PLAN:  HTN:      Blood pressure is well-controlled.  We will assume responsibility of managing his blood pressure going forward.  Hyperlipidemia: His last LDL is 92.  His LDL goal is between 50 and 70.  He is on rosuvastatin and Zetia.  At his request we will begin managing his lipids and we will refill his rosuvastatin and Zetia.   3.   Coronary artery disease:      When he needs refills we will refill his losartan 25 mg a day, Toprol-XL 50 mg a day, Zetia 10 mg a day, rosuvastatin 10 mg a day    Medication Adjustments/Labs and Tests Ordered: Current medicines are reviewed at length with the patient today.  Concerns regarding medicines are outlined above.  Orders Placed This Encounter  Procedures   Basic metabolic panel   ALT   Lipid panel   EKG 12-Lead    No orders of the defined types were placed in this encounter.     Patient Instructions  Increase your intake of fluids (V8 juice,  water with electrolyte supplements like Liquid IV,   Nuun tablets, ) , protein ( hard boiled eggs, chicken, fish)   Medication Instructions:  STOP Aspirin *If you need a refill on your cardiac medications before your next appointment, please call your pharmacy*   Lab Work: Lipids, ALT, BMET in 1 month If you have labs (blood work) drawn today and your tests are completely normal, you will receive your results only by: MyChart Message (if you have MyChart) OR A paper copy in the mail If you have any lab test that is abnormal or we need to change your treatment, we will call you to review the results.   Testing/Procedures: NONE   Follow-Up: At [facility], you and your health needs are our priority.  As part of our continuing mission to provide you with  exceptional heart care, we have created designated Provider Care Teams.  These Care Teams include your primary Cardiologist (physician) and Advanced Practice Providers (APPs -  Physician Assistants and Nurse Practitioners) who all work together to provide you with the care you need, when you need it.  We recommend signing up for the patient portal called "MyChart".  Sign up information is provided on this After Visit Summary.  MyChart is used to connect with patients for Virtual Visits (Telemedicine).  Patients are able to view lab/test results, encounter notes, upcoming appointments, etc.  Non-urgent messages can be sent to your provider as well.   To learn more about what you can do with MyChart, go to [email or web address].    Your next appointment:   1 year(s)  The format for your next appointment:   In Person  Provider:   Kristeen Miss, MD       Important Information About Sugar          Signed, Kristeen Miss, MD  08/17/2022 6:02 PM    [facility]

## 2022-09-15 ENCOUNTER — Other Ambulatory Visit: Payer: Self-pay | Admitting: Cardiovascular Disease

## 2022-09-18 ENCOUNTER — Other Ambulatory Visit: Payer: Self-pay

## 2022-09-18 MED ORDER — LOSARTAN POTASSIUM 25 MG PO TABS: 25.0000 mg | ORAL_TABLET | Freq: Every day | ORAL | 3 refills | Status: DC

## 2022-09-18 MED ORDER — METOPROLOL SUCCINATE ER 50 MG PO TB24: ORAL_TABLET | ORAL | 3 refills | Status: DC

## 2022-09-18 MED ORDER — EZETIMIBE 10 MG PO TABS: 10.0000 mg | ORAL_TABLET | Freq: Every day | ORAL | 3 refills | Status: DC

## 2022-09-18 MED ORDER — ROSUVASTATIN CALCIUM 10 MG PO TABS: 10.0000 mg | ORAL_TABLET | Freq: Every day | ORAL | 3 refills | Status: DC

## 2022-09-22 DIAGNOSIS — E039 Hypothyroidism, unspecified: Secondary | ICD-10-CM | POA: Diagnosis not present

## 2022-09-22 DIAGNOSIS — R5383 Other fatigue: Secondary | ICD-10-CM | POA: Diagnosis not present

## 2022-09-22 DIAGNOSIS — Z125 Encounter for screening for malignant neoplasm of prostate: Secondary | ICD-10-CM | POA: Diagnosis not present

## 2022-09-22 DIAGNOSIS — Z6827 Body mass index (BMI) 27.0-27.9, adult: Secondary | ICD-10-CM | POA: Diagnosis not present

## 2022-09-22 DIAGNOSIS — E782 Mixed hyperlipidemia: Secondary | ICD-10-CM | POA: Diagnosis not present

## 2022-10-26 DIAGNOSIS — Z981 Arthrodesis status: Secondary | ICD-10-CM | POA: Diagnosis not present

## 2022-10-26 DIAGNOSIS — K429 Umbilical hernia without obstruction or gangrene: Secondary | ICD-10-CM | POA: Diagnosis not present

## 2022-10-26 DIAGNOSIS — R1031 Right lower quadrant pain: Secondary | ICD-10-CM | POA: Diagnosis not present

## 2022-10-26 DIAGNOSIS — N200 Calculus of kidney: Secondary | ICD-10-CM | POA: Diagnosis not present

## 2022-10-26 DIAGNOSIS — I700 Atherosclerosis of aorta: Secondary | ICD-10-CM | POA: Diagnosis not present

## 2022-10-26 DIAGNOSIS — N281 Cyst of kidney, acquired: Secondary | ICD-10-CM | POA: Diagnosis not present

## 2022-10-26 DIAGNOSIS — T148XXA Other injury of unspecified body region, initial encounter: Secondary | ICD-10-CM | POA: Diagnosis not present

## 2022-10-26 DIAGNOSIS — R109 Unspecified abdominal pain: Secondary | ICD-10-CM | POA: Diagnosis not present

## 2022-10-26 DIAGNOSIS — S39011A Strain of muscle, fascia and tendon of abdomen, initial encounter: Secondary | ICD-10-CM | POA: Diagnosis not present

## 2022-10-26 DIAGNOSIS — R918 Other nonspecific abnormal finding of lung field: Secondary | ICD-10-CM | POA: Diagnosis not present

## 2022-10-26 DIAGNOSIS — X58XXXA Exposure to other specified factors, initial encounter: Secondary | ICD-10-CM | POA: Diagnosis not present

## 2022-11-20 DIAGNOSIS — E039 Hypothyroidism, unspecified: Secondary | ICD-10-CM | POA: Diagnosis not present

## 2023-04-02 DIAGNOSIS — L821 Other seborrheic keratosis: Secondary | ICD-10-CM | POA: Diagnosis not present

## 2023-04-02 DIAGNOSIS — L814 Other melanin hyperpigmentation: Secondary | ICD-10-CM | POA: Diagnosis not present

## 2023-04-02 DIAGNOSIS — L570 Actinic keratosis: Secondary | ICD-10-CM | POA: Diagnosis not present

## 2023-04-02 DIAGNOSIS — D1801 Hemangioma of skin and subcutaneous tissue: Secondary | ICD-10-CM | POA: Diagnosis not present

## 2023-06-03 DIAGNOSIS — I10 Essential (primary) hypertension: Secondary | ICD-10-CM | POA: Diagnosis not present

## 2023-06-03 DIAGNOSIS — E782 Mixed hyperlipidemia: Secondary | ICD-10-CM | POA: Diagnosis not present

## 2023-06-03 DIAGNOSIS — Z13220 Encounter for screening for lipoid disorders: Secondary | ICD-10-CM | POA: Diagnosis not present

## 2023-06-03 DIAGNOSIS — E039 Hypothyroidism, unspecified: Secondary | ICD-10-CM | POA: Diagnosis not present

## 2023-09-02 ENCOUNTER — Other Ambulatory Visit: Payer: Self-pay | Admitting: Cardiovascular Disease

## 2023-09-18 ENCOUNTER — Encounter: Payer: Self-pay | Admitting: Cardiovascular Disease

## 2023-09-18 NOTE — Progress Notes (Signed)
 Cardiology Office Note:    Date:  09/20/2023   ID:  Anthony Soto, DOB Jun 28, 1961, MRN [id number]  PCP:  Dottie Norleen PHEBE PONCE, MD  Cardiologist:  Azucena Dart  Electrophysiologist:  None   Referring MD: Dottie Norleen PHEBE PONCE, MD   Chief Complaint  Patient presents with   Coronary Artery Disease        Hyperlipidemia    Aug. 7, 2020    Anthony Soto is a 63 y.o. male who we were asked to see by Dr. Sherre for HTN with variable BP and variable HR.  Has had variable BP   Has random episodes of chest squeezing.   Not related to exercise,   Not related to twising . 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  He recently had a stress test which was abnormal.  We referred him for heart catheterization at that time.  I personally reviewed the films with Dr. Dann   He is found to have very mild abnormality of the proximal LAD.  He had minimal irregularities of the RCA and left circumflex region.  His left ventricular systolic function was normal.  His LVEDP was 13. He was started on metoprol 50 mg a day ,  We have increased it to Toprol  100 mg a day now  Is exercising regularly ,    Rarely eats salt,  Avoids salt   July 30, 2020: Anthony Soto is seen back today for follow-up of his mild coronary artery disease and hyperlipidemia.  He has a history of hypertension.  Exercising regularly ,  Staying active.   Has questions about family ,   Has made an appt for his mother in law to see me    Nov. 23, 2022: Anthony Soto is seen  Still working - insurance / real estate business.  Wants to retire in 3 years  Had a + stress test 2-3 years ago ,  hypertensive response Cath  showed minor irreg irreg.    Dec. 11, 2023  Anthony Soto seen today for follow-up visit.  He has a history of moderate coronary artery irregularities.  Has a history of hypertension and hyperlipidemia.  Had a vaso vagel episode  - initialted by a dog bite while he was playing with his dog .   Has been walking 2-3 miles a day , runs 3-4 miles several times a week.   Is dealing with aging parents   Jan. 13, 2025 Anthony Soto has been seen for follow up of his CAD ,  HLD  He wanted to review why he is on the metoprolol .  He had an abnormal stress test in 2020.  The heart cath subsequently revealed no coronary artery disease but he did have a hypertensive response to his stress test.  We have had him on metoprolol  XL since that time.   He would like to try taking just half of the metoprolol .  Will reduce his Toprol -XL to 50 mg just once a day.  He will continue to walk and run.  He will report back next year as to how he is feeling.  Past Medical History:  Diagnosis Date   Headache(784.0)    sinus   History of kidney stones    Hypertension    Hypothyroidism    Seizures (HCC)    25 yrs. ago from stress inducted    Past Surgical History:  Procedure Laterality Date   BACK SURGERY  2008   BACK SURGERY  2003   upper Back   LEFT HEART CATH AND CORONARY ANGIOGRAPHY N/A 05/25/2019   Procedure: LEFT HEART CATH AND CORONARY ANGIOGRAPHY;  Surgeon: Dann Candyce RAMAN, MD;  Location: [facility] INVASIVE CV LAB;  Service: Cardiovascular;  Laterality: N/A;   nasal septoplasty      Current Medications: Current Meds  Medication Sig   levothyroxine  (SYNTHROID ) 88 MCG tablet Take 88 mcg by mouth daily.   metoprolol  succinate (TOPROL -XL) 50 MG 24 hr tablet Take 1 tablet (50 mg total) by mouth daily. Take with or immediately following a meal.   [DISCONTINUED] aspirin  EC 81 MG tablet Take by mouth.     Allergies:   Levaquin  [levofloxacin ] and Penicillins   Social History   Socioeconomic History   Marital  status: Married    Spouse name: Not on file   Number of children: Not on file   Years of education: Not on file   Highest education level: Not on file  Occupational History   Not on file  Tobacco Use   Smoking status: Never   Smokeless tobacco: Never  Vaping Use   Vaping status: Never Used  Substance and Sexual Activity   Alcohol use: No   Drug use: No   Sexual activity: Not on file  Other Topics Concern   Not on file  Social History Narrative   Not on file   Social Drivers of Health   Financial Resource Strain: Not on file  Food Insecurity: Not on file  Transportation Needs: Not on file  Physical Activity: Not on file  Stress: Not on file  Social Connections: Unknown (01/19/2022)   Received from [facility], [facility]   Social Network    Social Network: Not on file     Family History: The patient's family history includes High blood pressure in his father; Hypothyroidism in his mother.  ROS:   Please see the history of present illness.     All other systems reviewed and are negative.  EKGs/Labs/Other Studies Reviewed:    The following studies were reviewed today:    Recent Labs: No results found for requested labs within last 365 days.  Recent Lipid Panel    Component Value Date/Time   CHOL 145 07/30/2021 1032   TRIG 103 07/30/2021 1032   HDL 45 07/30/2021 1032   CHOLHDL 3.2 07/30/2021 1032   CHOLHDL 3.3 05/25/2019 1023   VLDL 14 05/25/2019 1023   LDLCALC 81 07/30/2021 1032     Physical Exam: Blood pressure 124/84, pulse 65, height 5' 9 (1.753 m), weight 180 lb (81.6 kg), SpO2 95%.       GEN:  Well nourished, well developed in no acute distress HEENT: Normal NECK: No JVD; No carotid bruits LYMPHATICS: No lymphadenopathy CARDIAC: RRR , no murmurs, rubs, gallops RESPIRATORY:  Clear to auscultation without rales, wheezing or rhonchi  ABDOMEN: Soft, non-tender, non-distended MUSCULOSKELETAL:  No edema; No deformity  SKIN: Warm and  dry NEUROLOGIC:  Alert and oriented x 3  EKG:      EKG Interpretation Date/Time:  Monday September 20 2023 11:22:19 EST Ventricular Rate:  65 PR Interval:  158 QRS Duration:  80 QT Interval:  386 QTC Calculation: 401 R Axis:   55  Text Interpretation: Sinus rhythm with marked sinus arrhythmia When compared with ECG of 07-May-2017 09:19, No significant change was found Confirmed by Alveta Mungo ([id number]) on 09/20/2023 11:37:03 AM     ASSESSMENT:    1. Coronary artery disease involving native coronary artery of native heart without angina pectoris   2. Hyperlipidemia, unspecified hyperlipidemia type       PLAN:       HTN:      BP is well controlled.    2.  Hyperlipidemia:  check labs today     3.   Coronary artery disease:    he has minimal CAD  His LDL goal is < 70 Cont rosuvastatin   Check labs today         Medication Adjustments/Labs and Tests Ordered: Current medicines are reviewed at length with the patient today.  Concerns regarding medicines are outlined above.  Orders Placed This Encounter  Procedures   ALT   Basic metabolic panel   Lipid panel   EKG 12-Lead    Meds ordered this encounter  Medications   metoprolol  succinate (TOPROL -XL) 50 MG 24 hr tablet    Sig: Take 1 tablet (50 mg total) by mouth daily. Take with or immediately following a meal.    Dispense:  90 tablet    Refill:  3    Dose change (decreased).      Patient Instructions  Medication Instructions:  Your physician has recommended you make the following change in your medication:   1) DECREASE metoprolol  succinate (Toprol  XL) to 50 mg once daily  *If you need a refill on your cardiac medications before your next appointment, please call your pharmacy*  Lab Work: TODAY: Lipid panel, ALT, BMP If you have labs (blood work) drawn today and your tests are completely normal, you will receive your results only by: MyChart Message (if you have MyChart) OR A paper copy in the  mail If you have any lab test that is abnormal or we need to change your treatment, we will call you to review the results.  Testing/Procedures: None ordered today.  Follow-Up: At [facility], you and your health needs are our priority.  As part of our continuing mission to provide you with exceptional heart care, we have created designated Provider Care Teams.  These Care Teams include your primary Cardiologist (physician) and Advanced Practice Providers (APPs -  Physician Assistants and Nurse Practitioners) who all work together to provide you with the care you need, when you need it.  Your next appointment:   1 year(s)  The format for your next appointment:   In Person  Provider:   Ozell Fell, MD {  Other Instructions Try adding 1/4 tsp of Morton's lite salt to water  Add lemon jiuce or Mio    Signed, Mungo Alveta, MD  09/20/2023 1:40 PM    [facility]

## 2023-09-20 ENCOUNTER — Ambulatory Visit: Payer: BC Managed Care – PPO | Attending: Cardiovascular Disease | Admitting: Cardiovascular Disease

## 2023-09-20 VITALS — BP 124/84 | HR 65 | Ht 69.0 in | Wt 180.0 lb

## 2023-09-20 DIAGNOSIS — I2510 Atherosclerotic heart disease of native coronary artery without angina pectoris: Secondary | ICD-10-CM

## 2023-09-20 DIAGNOSIS — E785 Hyperlipidemia, unspecified: Secondary | ICD-10-CM

## 2023-09-20 MED ORDER — METOPROLOL SUCCINATE ER 50 MG PO TB24: 50.0000 mg | ORAL_TABLET | Freq: Every day | ORAL | 3 refills | Status: DC

## 2023-09-20 NOTE — Patient Instructions (Signed)
 Medication Instructions:  Your physician has recommended you make the following change in your medication:   1) DECREASE metoprolol  succinate (Toprol  XL) to 50 mg once daily  *If you need a refill on your cardiac medications before your next appointment, please call your pharmacy*  Lab Work: TODAY: Lipid panel, ALT, BMP If you have labs (blood work) drawn today and your tests are completely normal, you will receive your results only by: MyChart Message (if you have MyChart) OR A paper copy in the mail If you have any lab test that is abnormal or we need to change your treatment, we will call you to review the results.  Testing/Procedures: None ordered today.  Follow-Up: At [facility], you and your health needs are our priority.  As part of our continuing mission to provide you with exceptional heart care, we have created designated Provider Care Teams.  These Care Teams include your primary Cardiologist (physician) and Advanced Practice Providers (APPs -  Physician Assistants and Nurse Practitioners) who all work together to provide you with the care you need, when you need it.  Your next appointment:   1 year(s)  The format for your next appointment:   In Person  Provider:   Ozell Fell, MD {  Other Instructions Try adding 1/4 tsp of Morton's lite salt to water  Add lemon jiuce or Mio

## 2023-09-21 LAB — BASIC METABOLIC PANEL
BUN/Creatinine Ratio: 17 (ref 10–24)
BUN: 18 mg/dL (ref 8–27)
CO2: 25 mmol/L (ref 20–29)
Calcium: 10.1 mg/dL (ref 8.6–10.2)
Chloride: 102 mmol/L (ref 96–106)
Creatinine, Ser: 1.03 mg/dL (ref 0.76–1.27)
Glucose: 94 mg/dL (ref 70–99)
Potassium: 5 mmol/L (ref 3.5–5.2)
Sodium: 142 mmol/L (ref 134–144)
eGFR: 81 mL/min/{1.73_m2} (ref >59)

## 2023-09-21 LAB — ALT: ALT: 17 [IU]/L (ref 0–44)

## 2023-09-21 LAB — LIPID PANEL
Chol/HDL Ratio: 3 {ratio} (ref 0.0–5.0)
Cholesterol, Total: 150 mg/dL (ref 100–199)
HDL: 50 mg/dL (ref >39)
LDL Chol Calc (NIH): 78 mg/dL (ref 0–99)
Triglycerides: 126 mg/dL (ref 0–149)
VLDL Cholesterol Cal: 22 mg/dL (ref 5–40)

## 2023-11-09 DIAGNOSIS — J302 Other seasonal allergic rhinitis: Secondary | ICD-10-CM | POA: Diagnosis not present

## 2023-11-09 DIAGNOSIS — I10 Essential (primary) hypertension: Secondary | ICD-10-CM | POA: Diagnosis not present

## 2023-11-09 DIAGNOSIS — E039 Hypothyroidism, unspecified: Secondary | ICD-10-CM | POA: Diagnosis not present

## 2023-11-09 DIAGNOSIS — E782 Mixed hyperlipidemia: Secondary | ICD-10-CM | POA: Diagnosis not present

## 2023-12-21 DIAGNOSIS — E039 Hypothyroidism, unspecified: Secondary | ICD-10-CM | POA: Diagnosis not present

## 2024-04-07 DIAGNOSIS — L570 Actinic keratosis: Secondary | ICD-10-CM | POA: Diagnosis not present

## 2024-04-07 DIAGNOSIS — D225 Melanocytic nevi of trunk: Secondary | ICD-10-CM | POA: Diagnosis not present

## 2024-04-07 DIAGNOSIS — L821 Other seborrheic keratosis: Secondary | ICD-10-CM | POA: Diagnosis not present

## 2024-05-31 DIAGNOSIS — I10 Essential (primary) hypertension: Secondary | ICD-10-CM | POA: Diagnosis not present

## 2024-05-31 DIAGNOSIS — E039 Hypothyroidism, unspecified: Secondary | ICD-10-CM | POA: Diagnosis not present

## 2024-05-31 DIAGNOSIS — E782 Mixed hyperlipidemia: Secondary | ICD-10-CM | POA: Diagnosis not present

## 2024-06-08 DIAGNOSIS — M5416 Radiculopathy, lumbar region: Secondary | ICD-10-CM | POA: Diagnosis not present

## 2024-06-19 ENCOUNTER — Other Ambulatory Visit: Payer: Self-pay

## 2024-06-19 ENCOUNTER — Other Ambulatory Visit (HOSPITAL_BASED_OUTPATIENT_CLINIC_OR_DEPARTMENT_OTHER): Payer: Self-pay

## 2024-06-19 MED ORDER — MONTELUKAST SODIUM 10 MG PO TABS
10.0000 mg | ORAL_TABLET | Freq: Every day | ORAL | 2 refills | Status: AC
  Filled 2024-08-07 – 2024-09-13 (×2): qty 30, 30d supply, fill #0

## 2024-06-20 ENCOUNTER — Other Ambulatory Visit (HOSPITAL_BASED_OUTPATIENT_CLINIC_OR_DEPARTMENT_OTHER): Payer: Self-pay

## 2024-07-07 ENCOUNTER — Other Ambulatory Visit (HOSPITAL_BASED_OUTPATIENT_CLINIC_OR_DEPARTMENT_OTHER): Payer: Self-pay

## 2024-07-11 ENCOUNTER — Other Ambulatory Visit (HOSPITAL_COMMUNITY): Payer: Self-pay

## 2024-07-11 ENCOUNTER — Other Ambulatory Visit (HOSPITAL_BASED_OUTPATIENT_CLINIC_OR_DEPARTMENT_OTHER): Payer: Self-pay

## 2024-07-11 DIAGNOSIS — M4310 Spondylolisthesis, site unspecified: Secondary | ICD-10-CM | POA: Diagnosis not present

## 2024-07-11 DIAGNOSIS — Z6826 Body mass index (BMI) 26.0-26.9, adult: Secondary | ICD-10-CM | POA: Diagnosis not present

## 2024-07-11 DIAGNOSIS — M7138 Other bursal cyst, other site: Secondary | ICD-10-CM | POA: Diagnosis not present

## 2024-07-11 DIAGNOSIS — M5416 Radiculopathy, lumbar region: Secondary | ICD-10-CM | POA: Diagnosis not present

## 2024-07-11 MED ORDER — LEVOTHYROXINE SODIUM 100 MCG PO TABS
100.0000 ug | ORAL_TABLET | Freq: Every morning | ORAL | 1 refills | Status: AC
  Filled 2024-08-07: qty 30, 30d supply, fill #0
  Filled 2024-09-13: qty 30, 30d supply, fill #1
  Filled 2024-10-12: qty 30, 30d supply, fill #2

## 2024-07-11 MED ORDER — LOSARTAN POTASSIUM 25 MG PO TABS
25.0000 mg | ORAL_TABLET | Freq: Every evening | ORAL | 1 refills | Status: AC
  Filled 2024-07-11: qty 90, 90d supply, fill #0
  Filled 2024-08-07: qty 30, 30d supply, fill #0
  Filled 2024-09-13: qty 30, 30d supply, fill #1
  Filled 2024-10-12: qty 30, 30d supply, fill #2

## 2024-07-11 MED ORDER — ROSUVASTATIN CALCIUM 10 MG PO TABS
10.0000 mg | ORAL_TABLET | Freq: Every evening | ORAL | 1 refills | Status: AC
  Filled 2024-07-11: qty 90, 90d supply, fill #0
  Filled 2024-08-07: qty 30, 30d supply, fill #0
  Filled 2024-09-13: qty 30, 30d supply, fill #1
  Filled 2024-10-12: qty 30, 30d supply, fill #2

## 2024-07-11 MED ORDER — EZETIMIBE 10 MG PO TABS
10.0000 mg | ORAL_TABLET | Freq: Every evening | ORAL | 1 refills | Status: AC
  Filled 2024-07-11: qty 90, 90d supply, fill #0
  Filled 2024-08-07: qty 30, 30d supply, fill #0
  Filled 2024-09-13: qty 30, 30d supply, fill #1
  Filled 2024-10-12: qty 30, 30d supply, fill #2

## 2024-07-12 ENCOUNTER — Other Ambulatory Visit: Payer: Self-pay

## 2024-07-18 ENCOUNTER — Other Ambulatory Visit: Payer: Self-pay

## 2024-07-21 ENCOUNTER — Other Ambulatory Visit: Payer: Self-pay

## 2024-07-21 DIAGNOSIS — H2513 Age-related nuclear cataract, bilateral: Secondary | ICD-10-CM | POA: Diagnosis not present

## 2024-07-24 DIAGNOSIS — M545 Low back pain, unspecified: Secondary | ICD-10-CM | POA: Diagnosis not present

## 2024-07-24 DIAGNOSIS — M2560 Stiffness of unspecified joint, not elsewhere classified: Secondary | ICD-10-CM | POA: Diagnosis not present

## 2024-07-26 DIAGNOSIS — M2560 Stiffness of unspecified joint, not elsewhere classified: Secondary | ICD-10-CM | POA: Diagnosis not present

## 2024-07-26 DIAGNOSIS — M545 Low back pain, unspecified: Secondary | ICD-10-CM | POA: Diagnosis not present

## 2024-07-27 ENCOUNTER — Other Ambulatory Visit: Payer: Self-pay

## 2024-07-27 DIAGNOSIS — M5416 Radiculopathy, lumbar region: Secondary | ICD-10-CM | POA: Diagnosis not present

## 2024-07-31 DIAGNOSIS — M2560 Stiffness of unspecified joint, not elsewhere classified: Secondary | ICD-10-CM | POA: Diagnosis not present

## 2024-07-31 DIAGNOSIS — M545 Low back pain, unspecified: Secondary | ICD-10-CM | POA: Diagnosis not present

## 2024-08-07 ENCOUNTER — Other Ambulatory Visit (HOSPITAL_COMMUNITY): Payer: Self-pay

## 2024-08-07 ENCOUNTER — Other Ambulatory Visit: Payer: Self-pay

## 2024-08-09 ENCOUNTER — Other Ambulatory Visit: Payer: Self-pay

## 2024-08-11 ENCOUNTER — Other Ambulatory Visit: Payer: Self-pay

## 2024-08-22 ENCOUNTER — Other Ambulatory Visit: Payer: Self-pay | Admitting: Neurosurgery

## 2024-09-13 ENCOUNTER — Other Ambulatory Visit: Payer: Self-pay

## 2024-09-14 ENCOUNTER — Other Ambulatory Visit: Payer: Self-pay

## 2024-09-14 ENCOUNTER — Encounter: Payer: Self-pay | Admitting: Cardiovascular Disease

## 2024-09-21 ENCOUNTER — Encounter (HOSPITAL_COMMUNITY): Payer: Self-pay

## 2024-09-21 NOTE — Pre-Procedure Instructions (Signed)
 Surgical Instructions   Your procedure is scheduled on October 02, 2024. Report to [facility] Main Entrance A at 6:00 A.M., then check in with the Admitting office. Any questions or running late day of surgery: call [phone]  Questions prior to your surgery date: call [phone], Monday-Friday, 8am-4pm. If you experience any cold or flu symptoms such as cough, fever, chills, shortness of breath, etc. between now and your scheduled surgery, please notify us  at the above number.     Remember:  Do not eat or drink after midnight the night before your surgery    Take these medicines the morning of surgery with A SIP OF WATER: levothyroxine  (SYNTHROID )    May take these medicines IF NEEDED: montelukast  (SINGULAIR )    One week prior to surgery, STOP taking any Aspirin  (unless otherwise instructed by your surgeon) Aleve, Naproxen, Ibuprofen, Motrin, Advil, Goody's, BC's, all herbal medications, fish oil, and non-prescription vitamins.                     Do NOT Smoke (Tobacco/Vaping) for 24 hours prior to your procedure.  If you use a CPAP at night, you may bring your mask/headgear for your overnight stay.   You will be asked to remove any contacts, glasses, piercing's, hearing aid's, dentures/partials prior to surgery. Please bring cases for these items if needed.    Your surgeon will determine if you are to be admitted or discharged the same day.  Patients discharged the day of surgery will not be allowed to drive home, and someone needs to stay with them for 24 hours.  SURGICAL WAITING ROOM VISITATION Patients may have no more than 2 support people in the waiting area - these visitors may rotate.   Pre-op nurse will coordinate an appropriate time for 2 ADULT support persons, who may not rotate, to accompany patient in pre-op.  Children under the age of 61 must have an adult with them who is not the patient and must remain in the main waiting area with an adult.  If the  patient needs to stay at the hospital during part of their recovery, the visitor guidelines for inpatient rooms apply.  Please refer to the [facility] website for the visitor guidelines for any additional information.   If you received a COVID test during your pre-op visit  it is requested that you wear a mask when out in public, stay away from anyone that may not be feeling well and notify your surgeon if you develop symptoms. If you have been in contact with anyone that has tested positive in the last 10 days please notify you surgeon.      Pre-operative 4 CHG Bathing Instructions   You can play a key role in reducing the risk of infection after surgery. Your skin needs to be as free of germs as possible. You can reduce the number of germs on your skin by washing with CHG (chlorhexidine  gluconate) soap before surgery. CHG is an antiseptic soap that kills germs and continues to kill germs even after washing.   DO NOT use if you have an allergy to chlorhexidine /CHG or antibacterial soaps. If your skin becomes reddened or irritated, stop using the CHG and notify one of our RNs at [phone].   Please shower with the CHG soap starting 4 days before surgery using the following schedule:     Please keep in mind the following:  DO NOT shave, including legs and underarms, starting the day of your first  shower.   You may shave your face at any point before/day of surgery.  Place clean sheets on your bed the day you start using CHG soap. Use a clean washcloth (not used since being washed) for each shower. DO NOT sleep with pets once you start using the CHG.   CHG Shower Instructions:  Wash your face and private area with normal soap. If you choose to wash your hair, wash first with your normal shampoo.  After you use shampoo/soap, rinse your hair and body thoroughly to remove shampoo/soap residue.  Turn the water OFF and apply  bottle of CHG soap to a CLEAN washcloth.  Apply CHG soap ONLY  FROM YOUR NECK DOWN TO YOUR TOES (washing for 3-5 minutes)  DO NOT use CHG soap on face, private areas, open wounds, or sores.  Pay special attention to the area where your surgery is being performed.  If you are having back surgery, having someone wash your back for you may be helpful. Wait 2 minutes after CHG soap is applied, then you may rinse off the CHG soap.  Pat dry with a clean towel  Put on clean clothes/pajamas   If you choose to wear lotion, please use ONLY the CHG-compatible lotions that are listed below.  Additional instructions for the day of surgery:  If you choose, you may shower the morning of surgery with an antibacterial soap.  DO NOT APPLY any lotions, deodorants, cologne, or perfumes.   Do not bring valuables to the hospital. [facility] is not responsible for any belongings/valuables. Do not wear nail polish, gel polish, artificial nails, or any other type of covering on natural nails (fingers and toes) Do not wear jewelry or makeup Put on clean/comfortable clothes.  Please brush your teeth.  Ask your nurse before applying any prescription medications to the skin.     CHG Compatible Lotions   Aveeno Moisturizing lotion  Cetaphil Moisturizing Cream  Cetaphil Moisturizing Lotion  Clairol Herbal Essence Moisturizing Lotion, Dry Skin  Clairol Herbal Essence Moisturizing Lotion, Extra Dry Skin  Clairol Herbal Essence Moisturizing Lotion, Normal Skin  Curel Age Defying Therapeutic Moisturizing Lotion with Alpha Hydroxy  Curel Extreme Care Body Lotion  Curel Soothing Hands Moisturizing Hand Lotion  Curel Therapeutic Moisturizing Cream, Fragrance-Free  Curel Therapeutic Moisturizing Lotion, Fragrance-Free  Curel Therapeutic Moisturizing Lotion, Original Formula  Eucerin Daily Replenishing Lotion  Eucerin Dry Skin Therapy Plus Alpha Hydroxy Crme  Eucerin Dry Skin Therapy Plus Alpha Hydroxy Lotion  Eucerin Original Crme  Eucerin Original Lotion  Eucerin Plus  Crme Eucerin Plus Lotion  Eucerin TriLipid Replenishing Lotion  Keri Anti-Bacterial Hand Lotion  Keri Deep Conditioning Original Lotion Dry Skin Formula Softly Scented  Keri Deep Conditioning Original Lotion, Fragrance Free Sensitive Skin Formula  Keri Lotion Fast Absorbing Fragrance Free Sensitive Skin Formula  Keri Lotion Fast Absorbing Softly Scented Dry Skin Formula  Keri Original Lotion  Keri Skin Renewal Lotion Keri Silky Smooth Lotion  Keri Silky Smooth Sensitive Skin Lotion  Nivea Body Creamy Conditioning Oil  Nivea Body Extra Enriched Lotion  Nivea Body Original Lotion  Nivea Body Sheer Moisturizing Lotion Nivea Crme  Nivea Skin Firming Lotion  NutraDerm 30 Skin Lotion  NutraDerm Skin Lotion  NutraDerm Therapeutic Skin Cream  NutraDerm Therapeutic Skin Lotion  ProShield Protective Hand Cream  Provon moisturizing lotion  Please read over the following fact sheets that you were given.

## 2024-09-22 ENCOUNTER — Other Ambulatory Visit: Payer: Self-pay

## 2024-09-22 ENCOUNTER — Encounter (HOSPITAL_COMMUNITY)
Admission: RE | Admit: 2024-09-22 | Discharge: 2024-09-22 | Disposition: A | Discharge status: Home / Self Care | Source: Ambulatory Visit | Attending: Neurosurgery | Admitting: Neurosurgery

## 2024-09-22 ENCOUNTER — Encounter (HOSPITAL_COMMUNITY): Payer: Self-pay

## 2024-09-22 VITALS — BP 162/96 | HR 76 | Temp 98.0°F | Resp 17 | Ht 69.0 in | Wt 184.0 lb

## 2024-09-22 DIAGNOSIS — Z01818 Encounter for other preprocedural examination: Secondary | ICD-10-CM | POA: Insufficient documentation

## 2024-09-22 DIAGNOSIS — E039 Hypothyroidism, unspecified: Secondary | ICD-10-CM | POA: Insufficient documentation

## 2024-09-22 DIAGNOSIS — R9431 Abnormal electrocardiogram [ECG] [EKG]: Secondary | ICD-10-CM | POA: Insufficient documentation

## 2024-09-22 DIAGNOSIS — I10 Essential (primary) hypertension: Secondary | ICD-10-CM | POA: Insufficient documentation

## 2024-09-22 DIAGNOSIS — E785 Hyperlipidemia, unspecified: Secondary | ICD-10-CM | POA: Insufficient documentation

## 2024-09-22 DIAGNOSIS — I2510 Atherosclerotic heart disease of native coronary artery without angina pectoris: Secondary | ICD-10-CM | POA: Insufficient documentation

## 2024-09-22 DIAGNOSIS — R9439 Abnormal result of other cardiovascular function study: Secondary | ICD-10-CM

## 2024-09-22 DIAGNOSIS — Z79899 Other long term (current) drug therapy: Secondary | ICD-10-CM | POA: Insufficient documentation

## 2024-09-22 HISTORY — DX: Atherosclerotic heart disease of native coronary artery without angina pectoris: I25.10

## 2024-09-22 HISTORY — DX: Unspecified osteoarthritis, unspecified site: M19.90

## 2024-09-22 LAB — CBC
HCT: 46.7 % (ref 39.0–52.0)
Hemoglobin: 16.1 g/dL (ref 13.0–17.0)
MCH: 31.2 pg (ref 26.0–34.0)
MCHC: 34.5 g/dL (ref 30.0–36.0)
MCV: 90.5 fL (ref 80.0–100.0)
Platelets: 232 K/uL (ref 150–400)
RBC: 5.16 MIL/uL (ref 4.22–5.81)
RDW: 13.3 % (ref 11.5–15.5)
WBC: 8.3 K/uL (ref 4.0–10.5)
nRBC: 0 % (ref 0.0–0.2)

## 2024-09-22 LAB — BASIC METABOLIC PANEL WITH GFR
Anion gap: 10 (ref 5–15)
BUN: 16 mg/dL (ref 8–23)
CO2: 28 mmol/L (ref 22–32)
Calcium: 9.8 mg/dL (ref 8.9–10.3)
Chloride: 101 mmol/L (ref 98–111)
Creatinine, Ser: 0.79 mg/dL (ref 0.61–1.24)
GFR, Estimated: >60 mL/min
Glucose, Bld: 96 mg/dL (ref 70–99)
Potassium: 4.5 mmol/L (ref 3.5–5.1)
Sodium: 138 mmol/L (ref 135–145)

## 2024-09-22 LAB — TYPE AND SCREEN
ABO/RH(D): A POS
Antibody Screen: NEGATIVE

## 2024-09-22 LAB — SURGICAL PCR SCREEN
MRSA, PCR: NEGATIVE
Staphylococcus aureus: POSITIVE — AB

## 2024-09-22 NOTE — Progress Notes (Signed)
 PCP - Dr. Chad Nabors with [facility] Cardiologist - Dr. Ozell Fell - last office visit 09/20/2023 with Dr. Aleene Passe  PPM/ICD - Denies Device Orders - n/a Rep Notified - n/a  Chest x-ray - n/a EKG - 09/22/2024 Stress Test - 05/23/2019 - attempted but unable to finish due to pts BP. Heart cath completed instead. ECHO - 07/04/2019 Cardiac Cath - 05/25/2019  Sleep Study - Denies CPAP - n/a  No DM  Last dose of GLP1 agonist- n/a GLP1 instructions: n/a  Blood Thinner Instructions: n/a Aspirin  Instructions: n/a  NPO after midnight  COVID TEST- n/a   Anesthesia review: Yes. Hx of HTN and CAD with regular cardiology appointments. Per Dr. Allena note in January 2025, he was decreasing his metoprolol  from BID to daily and then pts PCP took him off medication sometime last year.   Patient denies shortness of breath, fever, cough and chest pain at PAT appointment. Pt denies any respiratory illness/infection in the last two months.   All instructions explained to the patient, with a verbal understanding of the material. Patient agrees to go over the instructions while at home for a better understanding. Patient also instructed to self quarantine after being tested for COVID-19. The opportunity to ask questions was provided.

## 2024-09-25 NOTE — Progress Notes (Signed)
 Anesthesia Chart Review:  64 year old male follows with cardiology for history of HTN, HLD, nonobstructive CAD.  Cath 05/2019 showed mild nonobstructive CAD.  Echo 06/2019 showed LVEF 60 to 65%, indeterminate diastolic parameters, normal RV systolic function, no significant valvular abnormalities.  Last seen in follow-up by Dr. Alveta on 09/20/2023 and noted to be doing well from cardiac standpoint, BP well-controlled.  Toprol -XL reduced to 50 mg once daily at patient request. 1 year followup recommended.  Other pertinent history includes hypothyroidism.  Preop labs reviewed, WNL.  EKG 09/22/2024: NSR.  Rate 64.  Nonspecific T wave abnormality.  TTE 07/04/2019: 1. Left ventricular ejection fraction, by visual estimation, is 60 to  65%. The left ventricle has normal function. Normal left ventricular size.  There is no left ventricular hypertrophy.   2. Left ventricular diastolic Doppler parameters are indeterminate  pattern of LV diastolic filling.   3. Global right ventricle has normal systolic function.The right  ventricular size is normal. No increase in right ventricular wall  thickness.   4. Left atrial size was normal.   5. Right atrial size was normal.   6. The mitral valve is normal in structure. Trace mitral valve  regurgitation. No evidence of mitral stenosis.   7. The tricuspid valve is normal in structure. Tricuspid valve  regurgitation was not visualized by color flow Doppler.   8. The aortic valve is tricuspid Aortic valve regurgitation was not  visualized by color flow Doppler. Structurally normal aortic valve, with  no evidence of sclerosis or stenosis.   9. The pulmonic valve was normal in structure. Pulmonic valve  regurgitation is not visualized by color flow Doppler.  10. The inferior vena cava is normal in size with greater than 50%  respiratory variability, suggesting right atrial pressure of 3 mmHg.   Cath 05/25/2019:  Prox LAD lesion is 25% stenosed. Prox RCA  lesion is 10% stenosed. Mid Cx lesion is 10% stenosed. The left ventricular systolic function is normal. LV end diastolic pressure is normal. LVEDP 13 mm Hg. The left ventricular ejection fraction is 55-65% by visual estimate. There is no aortic valve stenosis.   Nonobstructive CAD.   Continue preventive therapy and risk factor modification.     Lynwood Geofm RIGGERS [facility] Short Stay Center/Anesthesiology Phone [phone] 09/25/2024 4:02 PM

## 2024-09-25 NOTE — Anesthesia Preprocedure Evaluation (Addendum)
 "                                  Anesthesia Evaluation  Patient identified by MRN, date of birth, ID band Patient awake    Reviewed: Allergy & Precautions, NPO status , Patient's Chart, lab work & pertinent test results, reviewed documented beta blocker date and time   History of Anesthesia Complications Negative for: history of anesthetic complications  Airway Mallampati: II  TM Distance: >3 FB     Dental no notable dental hx.    Pulmonary neg COPD   breath sounds clear to auscultation       Cardiovascular hypertension, + CAD  (-) Past MI  Rhythm:Regular Rate:Normal     Neuro/Psych  Headaches, Seizures -, Well Controlled,     GI/Hepatic ,,,(+) neg Cirrhosis        Endo/Other  Hypothyroidism    Renal/GU Renal disease     Musculoskeletal  (+) Arthritis , Osteoarthritis,    Abdominal   Peds  Hematology   Anesthesia Other Findings   Reproductive/Obstetrics                              Anesthesia Physical Anesthesia Plan  ASA: 2  Anesthesia Plan: General   Post-op Pain Management:    Induction: Intravenous  PONV Risk Score and Plan: 2 and Ondansetron  and Dexamethasone   Airway Management Planned: Oral ETT  Additional Equipment:   Intra-op Plan:   Post-operative Plan:   Informed Consent: I have reviewed the patients History and Physical, chart, labs and discussed the procedure including the risks, benefits and alternatives for the proposed anesthesia with the patient or authorized representative who has indicated his/her understanding and acceptance.     Dental advisory given  Plan Discussed with: CRNA  Anesthesia Plan Comments: (PAT note by Lynwood Hope, PA-C:  64 year old male follows with cardiology for history of HTN, HLD, nonobstructive CAD.  Cath 05/2019 showed mild nonobstructive CAD.  Echo 06/2019 showed LVEF 60 to 65%, indeterminate diastolic parameters, normal RV systolic function, no  significant valvular abnormalities.  Last seen in follow-up by Dr. Alveta on 09/20/2023 and noted to be doing well from cardiac standpoint, BP well-controlled.  Toprol -XL reduced to 50 mg once daily at patient request. 1 year followup recommended.  Other pertinent history includes hypothyroidism.  Preop labs reviewed, WNL.  EKG 09/22/2024: NSR.  Rate 64.  Nonspecific T wave abnormality.  TTE 07/04/2019: 1. Left ventricular ejection fraction, by visual estimation, is 60 to  65%. The left ventricle has normal function. Normal left ventricular size.  There is no left ventricular hypertrophy.   2. Left ventricular diastolic Doppler parameters are indeterminate  pattern of LV diastolic filling.   3. Global right ventricle has normal systolic function.The right  ventricular size is normal. No increase in right ventricular wall  thickness.   4. Left atrial size was normal.   5. Right atrial size was normal.   6. The mitral valve is normal in structure. Trace mitral valve  regurgitation. No evidence of mitral stenosis.   7. The tricuspid valve is normal in structure. Tricuspid valve  regurgitation was not visualized by color flow Doppler.   8. The aortic valve is tricuspid Aortic valve regurgitation was not  visualized by color flow Doppler. Structurally normal aortic valve, with  no evidence of sclerosis or stenosis.   9. The  pulmonic valve was normal in structure. Pulmonic valve  regurgitation is not visualized by color flow Doppler.  10. The inferior vena cava is normal in size with greater than 50%  respiratory variability, suggesting right atrial pressure of 3 mmHg.   Cath 05/25/2019:   Prox LAD lesion is 25% stenosed.  Prox RCA lesion is 10% stenosed.  Mid Cx lesion is 10% stenosed.  The left ventricular systolic function is normal.  LV end diastolic pressure is normal. LVEDP 13 mm Hg.  The left ventricular ejection fraction is 55-65% by visual estimate.  There is no aortic  valve stenosis.   Nonobstructive CAD.   Continue preventive therapy and risk factor modification.  )         Anesthesia Quick Evaluation  "

## 2024-10-02 NOTE — Progress Notes (Signed)
 Patient updated with new date 2/6, arrival 0910, NPO

## 2024-10-06 ENCOUNTER — Encounter: Payer: Self-pay | Admitting: Cardiovascular Disease

## 2024-10-06 ENCOUNTER — Ambulatory Visit: Attending: Cardiovascular Disease | Admitting: Cardiovascular Disease

## 2024-10-06 VITALS — BP 126/80 | HR 89 | Ht 69.0 in | Wt 183.0 lb

## 2024-10-06 DIAGNOSIS — I2510 Atherosclerotic heart disease of native coronary artery without angina pectoris: Secondary | ICD-10-CM | POA: Diagnosis not present

## 2024-10-06 DIAGNOSIS — E782 Mixed hyperlipidemia: Secondary | ICD-10-CM | POA: Diagnosis not present

## 2024-10-06 DIAGNOSIS — I10 Essential (primary) hypertension: Secondary | ICD-10-CM | POA: Diagnosis not present

## 2024-10-06 NOTE — Assessment & Plan Note (Addendum)
 Cardiac catheterization from 2020 reviewed as outlined above with nonobstructive CAD.  Recommend follow-up in 2 years.  Patient will continue to follow-up with his PCP.

## 2024-10-06 NOTE — Patient Instructions (Signed)
 Medication Instructions:  No medication changes were made at this visit. Continue current regimen.   *If you need a refill on your cardiac medications before your next appointment, please call your pharmacy*  Lab Work: None ordered today. If you have labs (blood work) drawn today and your tests are completely normal, you will receive your results only by: MyChart Message (if you have MyChart) OR A paper copy in the mail If you have any lab test that is abnormal or we need to change your treatment, we will call you to review the results.  Testing/Procedures: None ordered today.  Follow-Up: At [facility], you and your health needs are our priority.  As part of our continuing mission to provide you with exceptional heart care, our providers are all part of one team.  This team includes your primary Cardiologist (physician) and Advanced Practice Providers or APPs (Physician Assistants and Nurse Practitioners) who all work together to provide you with the care you need, when you need it.  Your next appointment:   2 year(s)  Provider:   Ozell Fell, MD

## 2024-10-06 NOTE — Progress Notes (Signed)
 " Cardiology Office Note:    Date:  10/06/2024   ID:  Anthony Soto, DOB 08-18-1961, MRN [id number]  PCP:  Conley Dene BROCKS., MD   [facility] Providers Cardiologist:  Ozell Fell, MD     Referring MD: Dottie Norleen PHEBE PONCE, MD   Chief Complaint  Patient presents with   Hypertension    History of Present Illness:    Anthony Soto is a 64 y.o. male presenting for follow-up evaluation.  The patient has been followed by Dr. Alveta and I will be assuming his cardiac care in Dr. Allena retirement.  He has a history of hypertension, hyperlipidemia, and minimal nonobstructive coronary artery disease.  The patient is here alone today. He is scheduled for his 4th back surgery in the near future.  From a cardiac perspective, he has no concerns today.  He denies chest pain, chest pressure, or shortness of breath.  He has no exertional symptoms.  He has no heart palpitations.  Current Medications: Active Medications[1]   Allergies:   Levaquin  [levofloxacin ] and Penicillins   ROS:   Please see the history of present illness.    All other systems reviewed and are negative.  EKGs/Labs/Other Studies Reviewed:    The following studies were reviewed today: Cardiac Studies & Procedures   ______________________________________________________________________________________________ CARDIAC CATHETERIZATION  CARDIAC CATHETERIZATION 05/25/2019  Conclusion  Prox LAD lesion is 25% stenosed.  Prox RCA lesion is 10% stenosed.  Mid Cx lesion is 10% stenosed.  The left ventricular systolic function is normal.  LV end diastolic pressure is normal. LVEDP 13 mm Hg.  The left ventricular ejection fraction is 55-65% by visual estimate.  There is no aortic valve stenosis.  Nonobstructive CAD.  Continue preventive therapy and risk factor modification.  Findings Coronary Findings Diagnostic  Dominance: Right  Left Anterior Descending Prox LAD lesion is 25%  stenosed.  Left Circumflex Mid Cx lesion is 10% stenosed.  Right Coronary Artery Vessel is large. Prox RCA lesion is 10% stenosed.  Right Posterior Descending Artery Vessel is large in size.  Intervention  No interventions have been documented.   STRESS TESTS  EXERCISE TOLERANCE TEST (ETT) 05/23/2019  Interpretation Summary  Blood pressure demonstrated a hypertensive response to exercise.  Horizontal ST segment depression ST segment depression of 2 mm was noted during stress in the II, III, aVF, V4, V5 and V6 leads, and returning to baseline after less than 1 minute of recovery.  Positive stress test.  Hypertensive blood pressure response.  Excellent exercise capacity.   ECHOCARDIOGRAM  ECHOCARDIOGRAM COMPLETE 07/04/2019  Narrative ECHOCARDIOGRAM REPORT    Patient Name:   Anthony Soto [facility] Date of Exam: 07/04/2019 Medical Rec #:  [id number]         Height:       69.0 in Accession #:    [id number]        Weight:       174.1 lb Date of Birth:  1961/05/13         BSA:          1.95 m Patient Age:    57 years          BP:           138/84 mmHg Patient Gender: M                 HR:           61 bpm. Exam Location:  [address]  Procedure: 2D Echo, Cardiac Doppler and  Color Doppler  Indications:    I25.10  History:        Patient has no prior history of Echocardiogram examinations. CAD Risk Factors:Hypertension.  Sonographer:    Elsie Bohr RDCS Referring Phys: [phone] PHILIP J NAHSER  IMPRESSIONS   1. Left ventricular ejection fraction, by visual estimation, is 60 to 65%. The left ventricle has normal function. Normal left ventricular size. There is no left ventricular hypertrophy. 2. Left ventricular diastolic Doppler parameters are indeterminate pattern of LV diastolic filling. 3. Global right ventricle has normal systolic function.The right ventricular size is normal. No increase in right ventricular wall thickness. 4. Left atrial size was normal. 5.  Right atrial size was normal. 6. The mitral valve is normal in structure. Trace mitral valve regurgitation. No evidence of mitral stenosis. 7. The tricuspid valve is normal in structure. Tricuspid valve regurgitation was not visualized by color flow Doppler. 8. The aortic valve is tricuspid Aortic valve regurgitation was not visualized by color flow Doppler. Structurally normal aortic valve, with no evidence of sclerosis or stenosis. 9. The pulmonic valve was normal in structure. Pulmonic valve regurgitation is not visualized by color flow Doppler. 10. The inferior vena cava is normal in size with greater than 50% respiratory variability, suggesting right atrial pressure of 3 mmHg.  FINDINGS Left Ventricle: Left ventricular ejection fraction, by visual estimation, is 60 to 65%. The left ventricle has normal function. No evidence of left ventricular regional wall motion abnormalities. There is no left ventricular hypertrophy. Normal left ventricular size. Spectral Doppler shows Left ventricular diastolic Doppler parameters are indeterminate pattern of LV diastolic filling.  Right Ventricle: The right ventricular size is normal. No increase in right ventricular wall thickness. Global RV systolic function is has normal systolic function.  Left Atrium: Left atrial size was normal in size.  Right Atrium: Right atrial size was normal in size  Pericardium: There is no evidence of pericardial effusion.  Mitral Valve: The mitral valve is normal in structure. No evidence of mitral valve stenosis by observation. Trace mitral valve regurgitation.  Tricuspid Valve: The tricuspid valve is normal in structure. Tricuspid valve regurgitation was not visualized by color flow Doppler.  Aortic Valve: The aortic valve is tricuspid. Aortic valve regurgitation was not visualized by color flow Doppler. The aortic valve is structurally normal, with no evidence of sclerosis or stenosis.  Pulmonic Valve: The pulmonic  valve was normal in structure. Pulmonic valve regurgitation is not visualized by color flow Doppler.  Aorta: The aortic root, ascending aorta and aortic arch are all structurally normal, with no evidence of dilitation or obstruction.  Venous: The inferior vena cava is normal in size with greater than 50% respiratory variability, suggesting right atrial pressure of 3 mmHg.  IAS/Shunts: No atrial level shunt detected by color flow Doppler. No ventricular septal defect is seen or detected. There is no evidence of an atrial septal defect.    LEFT VENTRICLE PLAX 2D LVIDd:         4.20 cm  Diastology LVIDs:         2.80 cm  LV e' lateral:   9.03 cm/s LV PW:         1.10 cm  LV E/e' lateral: 7.1 LV IVS:        1.10 cm  LV e' medial:    6.96 cm/s LVOT diam:     2.00 cm  LV E/e' medial:  9.2 LV SV:         49 ml LV  SV Index:   24.86 LVOT Area:     3.14 cm   RIGHT VENTRICLE             IVC RV S prime:     12.30 cm/s  IVC diam: 0.80 cm TAPSE (M-mode): 2.2 cm  LEFT ATRIUM             Index       RIGHT ATRIUM           Index LA diam:        3.10 cm 1.59 cm/m  RA Pressure: 3.00 mmHg LA Vol (A2C):   45.2 ml 23.21 ml/m RA Area:     11.60 cm LA Vol (A4C):   29.9 ml 15.35 ml/m RA Volume:   28.70 ml  14.73 ml/m LA Biplane Vol: 37.2 ml 19.10 ml/m AORTIC VALVE LVOT Vmax:   80.30 cm/s LVOT Vmean:  52.700 cm/s LVOT VTI:    0.165 m  AORTA Ao Root diam: 3.40 cm Ao Asc diam:  3.10 cm  MITRAL VALVE                        TRICUSPID VALVE MV Area (PHT):                      Estimated RAP:  3.00 mmHg  MV Decel Time: 267 msec             SHUNTS MV E velocity: 64.30 cm/s 103 cm/s  Systemic VTI:  0.16 m MV A velocity: 55.70 cm/s 70.3 cm/s Systemic Diam: 2.00 cm MV E/A ratio:  1.15       1.5   Shelda Bruckner MD Electronically signed by Shelda Bruckner MD Signature Date/Time: 07/04/2019/4:09:39 PM    Final           ______________________________________________________________________________________________      EKG:        Recent Labs: 09/22/2024: BUN 16; Creatinine, Ser 0.79; Hemoglobin 16.1; Platelets 232; Potassium 4.5; Sodium 138  Recent Lipid Panel    Component Value Date/Time   CHOL 150 09/20/2023 1212   TRIG 126 09/20/2023 1212   HDL 50 09/20/2023 1212   CHOLHDL 3.0 09/20/2023 1212   CHOLHDL 3.3 05/25/2019 1023   VLDL 14 05/25/2019 1023   LDLCALC 78 09/20/2023 1212     Risk Assessment/Calculations:                Physical Exam:    VS:  BP 126/80 (BP Location: Right Arm, Patient Position: Sitting, Cuff Size: Normal)   Pulse 89   Ht 5' 9 (1.753 m)   Wt 183 lb (83 kg)   SpO2 97%   BMI 27.02 kg/m     Wt Readings from Last 3 Encounters:  10/06/24 183 lb (83 kg)  09/22/24 184 lb (83.5 kg)  09/20/23 180 lb (81.6 kg)     GEN:  Well nourished, well developed in no acute distress HEENT: Normal NECK: No JVD; No carotid bruits LYMPHATICS: No lymphadenopathy CARDIAC: RRR, no murmurs, rubs, gallops RESPIRATORY:  Clear to auscultation without rales, wheezing or rhonchi  ABDOMEN: Soft, non-tender, non-distended MUSCULOSKELETAL:  No edema; No deformity  SKIN: Warm and dry NEUROLOGIC:  Alert and oriented x 3 PSYCHIATRIC:  Normal affect   Assessment & Plan Coronary artery disease involving native coronary artery of native heart without angina pectoris Cardiac catheterization from 2020 reviewed as outlined above with nonobstructive CAD.  Recommend follow-up in 2 years.  Patient will  continue to follow-up with his PCP. Mixed hyperlipidemia Treated with a combination of rosuvastatin  and ezetimibe .  Last lipid panel showed an LDL of 78.  With minimal CAD, no changes were recommended to intensify his therapy. Essential hypertension Controlled with losartan .  No change recommended today.            Medication Adjustments/Labs and Tests Ordered: Current medicines are  reviewed at length with the patient today.  Concerns regarding medicines are outlined above.  No orders of the defined types were placed in this encounter.  No orders of the defined types were placed in this encounter.   Patient Instructions  Medication Instructions:  No medication changes were made at this visit. Continue current regimen.   *If you need a refill on your cardiac medications before your next appointment, please call your pharmacy*  Lab Work: None ordered today. If you have labs (blood work) drawn today and your tests are completely normal, you will receive your results only by: MyChart Message (if you have MyChart) OR A paper copy in the mail If you have any lab test that is abnormal or we need to change your treatment, we will call you to review the results.  Testing/Procedures: None ordered today.  Follow-Up: At [facility], you and your health needs are our priority.  As part of our continuing mission to provide you with exceptional heart care, our providers are all part of one team.  This team includes your primary Cardiologist (physician) and Advanced Practice Providers or APPs (Physician Assistants and Nurse Practitioners) who all work together to provide you with the care you need, when you need it.  Your next appointment:   2 year(s)  Provider:   Ozell Fell, MD     Signed, Ozell Fell, MD  10/06/2024 3:39 PM    [facility]     [1]  Current Meds  Medication Sig   ezetimibe  (ZETIA ) 10 MG tablet Take 1 tablet (10 mg total) by mouth at bedtime.   levothyroxine  (SYNTHROID ) 100 MCG tablet Take 1 tablet (100 mcg total) by mouth every morning first thing without food. Wait at least 45 minutes before eating after taking medication.   losartan  (COZAAR ) 25 MG tablet Take 1 tablet (25 mg total) by mouth at bedtime.   montelukast  (SINGULAIR ) 10 MG tablet Take 1 tablet (10 mg total) by mouth daily for allergies. (Patient taking  differently: Take 10 mg by mouth daily as needed (Allergies).)   rosuvastatin  (CRESTOR ) 10 MG tablet Take 1 tablet (10 mg total) by mouth at bedtime.   "

## 2024-10-11 ENCOUNTER — Ambulatory Visit: Admitting: Cardiovascular Disease

## 2024-10-12 ENCOUNTER — Other Ambulatory Visit: Payer: Self-pay

## 2024-10-13 ENCOUNTER — Encounter (HOSPITAL_COMMUNITY): Payer: Self-pay | Admitting: Neurosurgery

## 2024-10-13 ENCOUNTER — Ambulatory Visit (HOSPITAL_COMMUNITY)

## 2024-10-13 ENCOUNTER — Other Ambulatory Visit (HOSPITAL_COMMUNITY): Payer: Self-pay

## 2024-10-13 ENCOUNTER — Encounter (HOSPITAL_COMMUNITY): Admission: RE | Payer: Self-pay | Source: Home / Self Care

## 2024-10-13 ENCOUNTER — Other Ambulatory Visit: Payer: Self-pay

## 2024-10-13 ENCOUNTER — Observation Stay (HOSPITAL_COMMUNITY): Admission: RE | Admit: 2024-10-13 | Source: Home / Self Care | Admitting: Neurosurgery

## 2024-10-13 ENCOUNTER — Ambulatory Visit (HOSPITAL_COMMUNITY): Payer: Self-pay | Admitting: Physician Assistant

## 2024-10-13 DIAGNOSIS — Z981 Arthrodesis status: Principal | ICD-10-CM

## 2024-10-13 MED ORDER — PHENYLEPHRINE HCL-NACL 20-0.9 MG/250ML-% IV SOLN
INTRAVENOUS | Status: DC | PRN
  Administered 2024-10-13: 30 ug/min via INTRAVENOUS

## 2024-10-13 MED ORDER — KETAMINE HCL 50 MG/5ML IJ SOSY
PREFILLED_SYRINGE | INTRAMUSCULAR | Status: AC
  Filled 2024-10-13: qty 5

## 2024-10-13 MED ORDER — LIDOCAINE 2% (20 MG/ML) 5 ML SYRINGE
INTRAMUSCULAR | Status: DC | PRN
  Administered 2024-10-13: 80 mg via INTRAVENOUS

## 2024-10-13 MED ORDER — DEXAMETHASONE SOD PHOSPHATE PF 10 MG/ML IJ SOLN
INTRAMUSCULAR | Status: DC | PRN
  Administered 2024-10-13: 10 mg via INTRAVENOUS

## 2024-10-13 MED ORDER — VANCOMYCIN HCL 1000 MG IV SOLR
INTRAVENOUS | Status: DC | PRN
  Administered 2024-10-13: 1000 mg via TOPICAL

## 2024-10-13 MED ORDER — ONDANSETRON HCL 4 MG/2ML IJ SOLN
INTRAMUSCULAR | Status: AC
  Filled 2024-10-13: qty 2

## 2024-10-13 MED ORDER — PHENYLEPHRINE 80 MCG/ML (10ML) SYRINGE FOR IV PUSH (FOR BLOOD PRESSURE SUPPORT)
PREFILLED_SYRINGE | INTRAVENOUS | Status: DC | PRN
  Administered 2024-10-13: 160 ug via INTRAVENOUS
  Administered 2024-10-13: 80 ug via INTRAVENOUS
  Administered 2024-10-13: 160 ug via INTRAVENOUS

## 2024-10-13 MED ORDER — 0.9 % SODIUM CHLORIDE (POUR BTL) OPTIME
TOPICAL | Status: DC | PRN
  Administered 2024-10-13: 1000 mL

## 2024-10-13 MED ORDER — FENTANYL CITRATE (PF) 100 MCG/2ML IJ SOLN
25.0000 ug | INTRAMUSCULAR | Status: DC | PRN
  Administered 2024-10-13: 25 ug via INTRAVENOUS

## 2024-10-13 MED ORDER — ONDANSETRON HCL 4 MG PO TABS: 4.0000 mg | ORAL_TABLET | Freq: Four times a day (QID) | ORAL | Status: AC | PRN

## 2024-10-13 MED ORDER — SUGAMMADEX SODIUM 200 MG/2ML IV SOLN
INTRAVENOUS | Status: DC | PRN
  Administered 2024-10-13: 200 mg via INTRAVENOUS

## 2024-10-13 MED ORDER — PROPOFOL 10 MG/ML IV BOLUS
INTRAVENOUS | Status: AC
  Filled 2024-10-13: qty 20

## 2024-10-13 MED ORDER — MUPIROCIN 2 % EX OINT
1.0000 | TOPICAL_OINTMENT | Freq: Two times a day (BID) | CUTANEOUS | Status: AC
  Administered 2024-10-13: 1 via NASAL
  Filled 2024-10-13: qty 22

## 2024-10-13 MED ORDER — ACETAMINOPHEN 10 MG/ML IV SOLN
INTRAVENOUS | Status: AC
  Filled 2024-10-13: qty 100

## 2024-10-13 MED ORDER — MENTHOL 3 MG MT LOZG: 1.0000 | LOZENGE | OROMUCOSAL | Status: AC | PRN

## 2024-10-13 MED ORDER — LOSARTAN POTASSIUM 25 MG PO TABS
25.0000 mg | ORAL_TABLET | Freq: Every day | ORAL | Status: AC
  Administered 2024-10-13: 25 mg via ORAL
  Filled 2024-10-13: qty 1

## 2024-10-13 MED ORDER — METHOCARBAMOL 1000 MG/10ML IJ SOLN
INTRAMUSCULAR | Status: DC | PRN
  Administered 2024-10-13: 1000 mg via INTRAVENOUS

## 2024-10-13 MED ORDER — CHLORHEXIDINE GLUCONATE CLOTH 2 % EX PADS: 6.0000 | MEDICATED_PAD | Freq: Once | CUTANEOUS | Status: DC

## 2024-10-13 MED ORDER — VANCOMYCIN HCL 1000 MG IV SOLR
INTRAVENOUS | Status: AC
  Filled 2024-10-13: qty 20

## 2024-10-13 MED ORDER — LEVOTHYROXINE SODIUM 100 MCG PO TABS: 100.0000 ug | ORAL_TABLET | Freq: Every morning | ORAL | Status: AC

## 2024-10-13 MED ORDER — PROPOFOL 10 MG/ML IV BOLUS
INTRAVENOUS | Status: DC | PRN
  Administered 2024-10-13: 150 mg via INTRAVENOUS

## 2024-10-13 MED ORDER — MONTELUKAST SODIUM 10 MG PO TABS: 10.0000 mg | ORAL_TABLET | Freq: Every day | ORAL | Status: AC | PRN

## 2024-10-13 MED ORDER — METHOCARBAMOL 1000 MG/10ML IJ SOLN
INTRAMUSCULAR | Status: AC
  Filled 2024-10-13: qty 10

## 2024-10-13 MED ORDER — CEFAZOLIN SODIUM-DEXTROSE 1-4 GM/50ML-% IV SOLN
1.0000 g | Freq: Three times a day (TID) | INTRAVENOUS | Status: AC
  Administered 2024-10-13: 1 g via INTRAVENOUS
  Filled 2024-10-13: qty 50

## 2024-10-13 MED ORDER — FENTANYL CITRATE (PF) 100 MCG/2ML IJ SOLN
INTRAMUSCULAR | Status: AC
  Filled 2024-10-13: qty 2

## 2024-10-13 MED ORDER — MUPIROCIN 2 % EX OINT
1.0000 | TOPICAL_OINTMENT | Freq: Two times a day (BID) | CUTANEOUS | 0 refills | Status: AC
  Filled 2024-10-13: qty 22, 11d supply, fill #0
  Filled 2024-10-13: qty 66, 33d supply, fill #0

## 2024-10-13 MED ORDER — KETAMINE HCL 10 MG/ML IJ SOLN
INTRAMUSCULAR | Status: DC | PRN
  Administered 2024-10-13: 10 mg via INTRAVENOUS
  Administered 2024-10-13: 30 mg via INTRAVENOUS
  Administered 2024-10-13: 10 mg via INTRAVENOUS

## 2024-10-13 MED ORDER — HYDROMORPHONE HCL 1 MG/ML IJ SOLN
INTRAMUSCULAR | Status: AC
  Filled 2024-10-13: qty 0.5

## 2024-10-13 MED ORDER — OXYCODONE HCL 5 MG/5ML PO SOLN: 5.0000 mg | Freq: Once | ORAL | Status: DC | PRN

## 2024-10-13 MED ORDER — DEXAMETHASONE SOD PHOSPHATE PF 10 MG/ML IJ SOLN
INTRAMUSCULAR | Status: AC
  Filled 2024-10-13: qty 1

## 2024-10-13 MED ORDER — ROSUVASTATIN CALCIUM 5 MG PO TABS
10.0000 mg | ORAL_TABLET | Freq: Every day | ORAL | Status: AC
  Administered 2024-10-13: 10 mg via ORAL
  Filled 2024-10-13: qty 2

## 2024-10-13 MED ORDER — CHLORHEXIDINE GLUCONATE CLOTH 2 % EX PADS
6.0000 | MEDICATED_PAD | Freq: Once | CUTANEOUS | Status: AC
  Administered 2024-10-13: 6 via TOPICAL

## 2024-10-13 MED ORDER — ALBUMIN HUMAN 5 % IV SOLN: INTRAVENOUS | Status: DC | PRN

## 2024-10-13 MED ORDER — FENTANYL CITRATE (PF) 100 MCG/2ML IJ SOLN
INTRAMUSCULAR | Status: DC | PRN
  Administered 2024-10-13 (×3): 50 ug via INTRAVENOUS

## 2024-10-13 MED ORDER — EZETIMIBE 10 MG PO TABS: 10.0000 mg | ORAL_TABLET | Freq: Every day | ORAL | Status: AC

## 2024-10-13 MED ORDER — CHLORHEXIDINE GLUCONATE 0.12 % MT SOLN: 15.0000 mL | Freq: Once | OROMUCOSAL | Status: AC

## 2024-10-13 MED ORDER — HYDROCODONE-ACETAMINOPHEN 10-325 MG PO TABS
1.0000 | ORAL_TABLET | ORAL | Status: AC | PRN
  Administered 2024-10-13 (×2): 1 via ORAL
  Filled 2024-10-13 (×2): qty 1

## 2024-10-13 MED ORDER — CEFAZOLIN SODIUM-DEXTROSE 2-4 GM/100ML-% IV SOLN
2.0000 g | INTRAVENOUS | Status: AC
  Administered 2024-10-13: 2 g via INTRAVENOUS

## 2024-10-13 MED ORDER — MIDAZOLAM HCL (PF) 2 MG/2ML IJ SOLN
INTRAMUSCULAR | Status: DC | PRN
  Administered 2024-10-13: 2 mg via INTRAVENOUS

## 2024-10-13 MED ORDER — ONDANSETRON HCL 4 MG/2ML IJ SOLN: 4.0000 mg | Freq: Four times a day (QID) | INTRAMUSCULAR | Status: AC | PRN

## 2024-10-13 MED ORDER — ARTIFICIAL TEARS OPHTHALMIC OINT
TOPICAL_OINTMENT | OPHTHALMIC | Status: AC
  Filled 2024-10-13: qty 3.5

## 2024-10-13 MED ORDER — CHLORHEXIDINE GLUCONATE 4 % EX SOLN
1.0000 | CUTANEOUS | 1 refills | Status: AC
  Filled 2024-10-13: qty 946, fill #0
  Filled 2024-10-13: qty 946, 90d supply, fill #0

## 2024-10-13 MED ORDER — POLYETHYLENE GLYCOL 3350 17 G PO PACK: 17.0000 g | PACK | Freq: Every day | ORAL | Status: AC | PRN

## 2024-10-13 MED ORDER — ONDANSETRON HCL 4 MG/2ML IJ SOLN
INTRAMUSCULAR | Status: DC | PRN
  Administered 2024-10-13: 4 mg via INTRAVENOUS

## 2024-10-13 MED ORDER — ORAL CARE MOUTH RINSE: 15.0000 mL | Freq: Once | OROMUCOSAL | Status: AC

## 2024-10-13 MED ORDER — ROCURONIUM BROMIDE 10 MG/ML (PF) SYRINGE
PREFILLED_SYRINGE | INTRAVENOUS | Status: AC
  Filled 2024-10-13: qty 10

## 2024-10-13 MED ORDER — ACETAMINOPHEN 325 MG PO TABS: 650.0000 mg | ORAL_TABLET | ORAL | Status: AC | PRN

## 2024-10-13 MED ORDER — PHENYLEPHRINE HCL-NACL 20-0.9 MG/250ML-% IV SOLN
INTRAVENOUS | Status: AC
  Filled 2024-10-13: qty 250

## 2024-10-13 MED ORDER — CHLORHEXIDINE GLUCONATE 0.12 % MT SOLN
OROMUCOSAL | Status: AC
  Administered 2024-10-13: 15 mL via OROMUCOSAL
  Filled 2024-10-13: qty 15

## 2024-10-13 MED ORDER — ROCURONIUM BROMIDE 10 MG/ML (PF) SYRINGE
PREFILLED_SYRINGE | INTRAVENOUS | Status: DC | PRN
  Administered 2024-10-13: 20 mg via INTRAVENOUS
  Administered 2024-10-13 (×2): 10 mg via INTRAVENOUS
  Administered 2024-10-13: 70 mg via INTRAVENOUS

## 2024-10-13 MED ORDER — ACETAMINOPHEN 650 MG RE SUPP: 650.0000 mg | RECTAL | Status: AC | PRN

## 2024-10-13 MED ORDER — MIDAZOLAM HCL 2 MG/2ML IJ SOLN
INTRAMUSCULAR | Status: AC
  Filled 2024-10-13: qty 2

## 2024-10-13 MED ORDER — LIDOCAINE 2% (20 MG/ML) 5 ML SYRINGE
INTRAMUSCULAR | Status: AC
  Filled 2024-10-13: qty 5

## 2024-10-13 MED ORDER — BUPIVACAINE HCL (PF) 0.25 % IJ SOLN
INTRAMUSCULAR | Status: DC | PRN
  Administered 2024-10-13: 20 mL

## 2024-10-13 MED ORDER — OXYCODONE HCL 5 MG PO TABS: 5.0000 mg | ORAL_TABLET | Freq: Once | ORAL | Status: DC | PRN

## 2024-10-13 MED ORDER — CHLORHEXIDINE GLUCONATE CLOTH 2 % EX PADS: 6.0000 | MEDICATED_PAD | Freq: Every day | CUTANEOUS | Status: AC

## 2024-10-13 MED ORDER — HYDROMORPHONE HCL 1 MG/ML IJ SOLN: 1.0000 mg | INTRAMUSCULAR | Status: AC | PRN

## 2024-10-13 MED ORDER — LACTATED RINGERS IV SOLN: INTRAVENOUS | Status: DC

## 2024-10-13 MED ORDER — FLEET ENEMA RE ENEM: 1.0000 | ENEMA | Freq: Once | RECTAL | Status: AC | PRN

## 2024-10-13 MED ORDER — SODIUM CHLORIDE 0.9 % IV SOLN
250.0000 mL | INTRAVENOUS | Status: AC
  Administered 2024-10-13: 250 mL via INTRAVENOUS

## 2024-10-13 MED ORDER — ONDANSETRON HCL 4 MG/2ML IJ SOLN: 4.0000 mg | Freq: Once | INTRAMUSCULAR | Status: DC | PRN

## 2024-10-13 MED ORDER — CEFAZOLIN SODIUM-DEXTROSE 2-4 GM/100ML-% IV SOLN
INTRAVENOUS | Status: AC
  Filled 2024-10-13: qty 100

## 2024-10-13 MED ORDER — SODIUM CHLORIDE 0.9% FLUSH: 3.0000 mL | INTRAVENOUS | Status: AC | PRN

## 2024-10-13 MED ORDER — HYDROMORPHONE HCL 1 MG/ML IJ SOLN
INTRAMUSCULAR | Status: DC | PRN
  Administered 2024-10-13: .5 mg via INTRAVENOUS

## 2024-10-13 MED ORDER — THROMBIN 20000 UNITS EX SOLR
CUTANEOUS | Status: DC | PRN
  Administered 2024-10-13: 20 mL via TOPICAL

## 2024-10-13 MED ORDER — PHENOL 1.4 % MT LIQD: 1.0000 | OROMUCOSAL | Status: AC | PRN

## 2024-10-13 MED ORDER — THROMBIN 20000 UNITS EX SOLR
CUTANEOUS | Status: AC
  Filled 2024-10-13: qty 20000

## 2024-10-13 MED ORDER — SODIUM CHLORIDE 0.9% FLUSH
3.0000 mL | Freq: Two times a day (BID) | INTRAVENOUS | Status: AC
  Administered 2024-10-13: 3 mL via INTRAVENOUS

## 2024-10-13 MED ORDER — BISACODYL 10 MG RE SUPP: 10.0000 mg | Freq: Every day | RECTAL | Status: AC | PRN

## 2024-10-13 MED ORDER — ACETAMINOPHEN 10 MG/ML IV SOLN: 1000.0000 mg | Freq: Once | INTRAVENOUS | Status: DC | PRN

## 2024-10-13 MED ORDER — DIAZEPAM 5 MG PO TABS
5.0000 mg | ORAL_TABLET | Freq: Four times a day (QID) | ORAL | Status: AC | PRN
  Administered 2024-10-13: 5 mg via ORAL
  Filled 2024-10-13: qty 1

## 2024-10-13 MED ORDER — BUPIVACAINE HCL (PF) 0.25 % IJ SOLN
INTRAMUSCULAR | Status: AC
  Filled 2024-10-13: qty 30

## 2024-10-13 NOTE — Anesthesia Procedure Notes (Signed)
 Procedure Name: Intubation Date/Time: 10/13/2024 12:02 PM  Performed by: Theophilus Burnet, Aloysius Pour, CRNAPre-anesthesia Checklist: Patient identified, Emergency Drugs available, Suction available and Patient being monitored Patient Re-evaluated:Patient Re-evaluated prior to induction Oxygen Delivery Method: Circle system utilized Preoxygenation: Pre-oxygenation with 100% oxygen Induction Type: IV induction Ventilation: Mask ventilation without difficulty Laryngoscope Size: Miller and 2 Grade View: Grade I Tube type: Oral Tube size: 7.0 mm Number of attempts: 1 Airway Equipment and Method: Stylet Placement Confirmation: ETT inserted through vocal cords under direct vision, positive ETCO2 and breath sounds checked- equal and bilateral Secured at: 22 cm Tube secured with: Tape Dental Injury: Teeth and Oropharynx as per pre-operative assessment

## 2024-10-13 NOTE — Op Note (Signed)
 Date of procedure: 10/13/2024  Date of dictation: Same  Service: Neurosurgery  Preoperative diagnosis: L2-3 adjacent level degeneration with stenosis and neurogenic claudication with right L2-3 adherent synovial cyst status post prior L3-4 decompression and fusion with instrumentation  Postoperative diagnosis: Same  Procedure Name: Right L2-3 laminotomy with resection of adherent synovial cyst requiring micro dissection  Left L2-3 decompressive laminotomy and foraminotomies, more than we required for simple interbody fusion alone.  Bilateral L2-3 posterior lumbar interbody fusion utilizing interbody cages, local harvested autograft, and morselized allograft  L2-3 posterolateral thesis utilizing nonsegmental pedicle screw fixation and local autografting  Reexploration of L3-4 posterior lateral fusion with removal of hardware  Surgeon:Calden Dorsey A.Indra Wolters, M.D.  Asst. Surgeon: Jennetta, NP  Assistant utilized for exposure, decompression, fusion, instrumentation, closure  Anesthesia: General  Indication: 64 year old male remotely status post L3-4 decompression and fusion with good results presents now with worsening back and bilateral lower extremity symptoms failing conservative management workup demonstrates evidence of marked adjacent level degeneration with severe facet arthropathy and severe stenosis.  Cysts in addition to the facet arthropathy the patient has a large right-sided L2-3 synovial cyst which is causing significant compression of the thecal sac and right L3 nerve root.  Patient presents now for decompression and fusion as well as resection of a synovial cyst.  Operative note: After induction of anesthesia, patient positioned prone on the Wilson frame and appropriately padded.  Lumbar region prepped and draped sterilely.  Incision made from L2-L4.  Dissection performed bilaterally.  Retractor placed.  Fluoroscopy used.  Levels confirmed.  Previously placed pedicle screw mentation at  L3-4 was dissected free.  This was partially encased in bone requiring the bone to be resected using the high-speed drill and various instruments.  The fusion at L3-4 was quite solid.  The rod construct was disassembled.  The screws were left in place but the rods and caps were removed.  Attention then placed to the L2-3 level.  Starting first on the left side decompressive laminotomy and facetectomy then performed using Leksell rongeur's, Kerrison rongeur's and high-speed drill to remove the inferior two thirds of the lamina of L2.  The inferior facet of L2 and pars interarticularis was resected as was the superior articular process of L3 and the superior aspect of the L3 lamina.  Ligament flavum elevated and resected.  Foraminotomies complete on the course the exiting L2 and L3 nerve roots.  Attention then placed to the right side.  Once again decompressive laminotomies and facetectomies were performed in a similar fashion.  Ligamentum flavum was elevated.  A large synovial cyst which was densely adherent to the thecal sac was exposed.  This was dissected free using the microscope for micro dissection completely resecting the cyst and completely decompress compressing the spinal canal without evidence of injury to the thecal sac or nerve roots.  Bilateral discectomies then performed at L3-4.  The space is then prepared for interbody fusion.  Withdistractor on the patient's right side the space was cleaned of all soft tissue and the endplates were prepared.  A 9 mm Medtronic expandable cage was then impacted in place and expanded.  Distractor removed patient's right side.  The space prepared on the right side.  Morselized autograft packed in the interspace.  A second cage was then impacted in the place and expanded.  Pedicles of L2 were identified using surface landmarks and intraoperative fluoroscopy Rod pilot holes were drilled overlying the pedicles.  Pedicles were then probed using a pedicle awl.  Each pedicle  tract was probed and found to be solidly within the bone.  Each pedicle tract was tapped with a screw tap.  Each screw hole was probed and found to be solidly within the bone.  6.5 mm Solera screws from Medtronic were placed bilaterally at L2.  Final images reveal good position of the cage and the hardware at the proper level with normal alignment of the spine.  Transverse processes of L2 and L3 were decorticated.  Morselized allograft was packed posterolaterally for later fusion.  Each cage was packed with demineralized bone fibers.  Gelfoam was placed over the laminotomy defects.  Short segment titanium rod placed through the screw heads at L2-3.  Locking caps placed over the screws.  Locking caps then engaged with the construct under mild compression.  Vancomycin  powder was placed in the deep wound space.  Wound is then closed in layers with Vicryl sutures.  Steri-Strips and sterile dressing were applied.  No apparent complications.  Patient tolerated the procedure well and he returns to the recovery room postop.

## 2024-10-13 NOTE — Transfer of Care (Signed)
 Immediate Anesthesia Transfer of Care Note  Patient: Anthony Soto  Procedure(s) Performed: POSTERIOR LUMBAR FUSION LUMBAR TWO-THREE POSTERIOR LATERAL AND INTERBODY FUSION WITH HARDWARE REMOVAL 1 LEVEL (Back)  Patient Location: PACU  Anesthesia Type:General  Level of Consciousness: awake and drowsy  Airway & Oxygen Therapy: Patient Spontanous Breathing  Post-op Assessment: Report given to RN  Post vital signs: stable  Last Vitals:  Vitals Value Taken Time  BP 123/74 10/13/24 15:45  Temp 37.2 C 10/13/24 15:41  Pulse 87 10/13/24 15:49  Resp 11 10/13/24 15:49  SpO2 99 % 10/13/24 15:49  Vitals shown include unfiled device data.  Last Pain:  Vitals:   10/13/24 1541  TempSrc:   PainSc: Asleep         Complications: There were no known notable events for this encounter.

## 2024-10-13 NOTE — Brief Op Note (Signed)
 OR Date: 10/13/2024 OR Patient Start Time: 11:28 AM  3:21 PM  PATIENT:  Maida ORN House  64 y.o. male  PRE-OPERATIVE DIAGNOSIS:  Degenerative spondylolisthesis  POST-OPERATIVE DIAGNOSIS:  Degenerative spondylolisthesis  PROCEDURE:  Procedures: POSTERIOR LUMBAR FUSION LUMBAR TWO-THREE POSTERIOR LATERAL AND INTERBODY FUSION WITH HARDWARE REMOVAL 1 LEVEL (N/A)  SURGEON:  Surgeons and Role:    DEWAINE Louis Shove, MD - Primary  PHYSICIAN ASSISTANT:   ASSISTANTSBETHA Jennetta PIETY   ANESTHESIA:   general  EBL:  300 mL   BLOOD ADMINISTERED:none  DRAINS: none   LOCAL MEDICATIONS USED:  MARCAINE      SPECIMEN:  No Specimen  DISPOSITION OF SPECIMEN:  N/A  COUNTS:  YES  TOURNIQUET:  * No tourniquets in log *  DICTATION: .Dragon Dictation  PLAN OF CARE: Admit for overnight observation  PATIENT DISPOSITION:  PACU - hemodynamically stable.   Delay start of Pharmacological VTE agent (>24hrs) due to surgical blood loss or risk of bleeding: yes

## 2024-10-13 NOTE — H&P (Signed)
 " Anthony Soto is an 64 y.o. male.   Chief Complaint: Back pain HPI: 64 year old male remotely status post L3-4 decompression and fusion with good results.  Patient status post prior decompression surgery at L4-5 without complications presents with worsening back and bilateral lower extremity symptoms predominantly to the anterior aspects of both thighs.  Symptoms have failed conservative management her workup demonstrates evidence of adjacent level degeneration with significant stenosis coupled with` a significant rightward L2-3 synovial cyst causing further compression.  The patient presents now for L2-3 decompression and fusion in hopes improving his symptoms.  Past Medical History:  Diagnosis Date   Arthritis    Back   Coronary artery disease    Mild   Headache(784.0)    sinus   History of kidney stones    Hypertension    Hypothyroidism    Seizures (HCC)    25 yrs. ago from stress inducted    Past Surgical History:  Procedure Laterality Date   BACK SURGERY  09/07/2006   BACK SURGERY  09/07/2001   upper Back   BACK SURGERY  2018   Dr. Louis   COLONOSCOPY     LEFT HEART CATH AND CORONARY ANGIOGRAPHY N/A 05/25/2019   Procedure: LEFT HEART CATH AND CORONARY ANGIOGRAPHY;  Surgeon: Dann Candyce RAMAN, MD;  Location: [facility] INVASIVE CV LAB;  Service: Cardiovascular;  Laterality: N/A;   nasal septoplasty      Family History  Problem Relation Age of Onset   Hypothyroidism Mother    High blood pressure Father    Social History:  reports that he has never smoked. He has never used smokeless tobacco. He reports that he does not drink alcohol and does not use drugs.  Allergies: Allergies[1]  Medications Prior to Admission  Medication Sig Dispense Refill   ezetimibe  (ZETIA ) 10 MG tablet Take 1 tablet (10 mg total) by mouth at bedtime. 90 tablet 1   levothyroxine  (SYNTHROID ) 100 MCG tablet Take 1 tablet (100 mcg total) by mouth every morning first thing without food. Wait at least  45 minutes before eating after taking medication. 90 tablet 1   losartan  (COZAAR ) 25 MG tablet Take 1 tablet (25 mg total) by mouth at bedtime. 90 tablet 1   rosuvastatin  (CRESTOR ) 10 MG tablet Take 1 tablet (10 mg total) by mouth at bedtime. 90 tablet 1   montelukast  (SINGULAIR ) 10 MG tablet Take 1 tablet (10 mg total) by mouth daily for allergies. (Patient taking differently: Take 10 mg by mouth daily as needed (Allergies).) 90 tablet 2    No results found for this or any previous visit (from the past 48 hours). No results found.  Pertinent items noted in HPI and remainder of comprehensive ROS otherwise negative.  Blood pressure (!) 155/86, pulse 74, temperature 98.2 F (36.8 C), temperature source Oral, resp. rate 18, height 5' 9 (1.753 m), weight 81.2 kg, SpO2 98%.  Patient is awake and alert.  He is oriented and appropriate.  Speech is fluent.  Judgment insight are intact.  Cranial nerve function normal bilateral.  Motor examination reveals intact motor strength in both lower extremities.  Sensory examination with some decrease sensation into his L3 dermatomes right worse than left.  Reflexes are hypoactive but symmetric.  Noes long track signs.  Gait is antalgic.  Posture is mildly flexed.  Examination head ears eyes nose throat is unremarkable chest and abdomen are benign.  Extremities are free from major deformity. Assessment/Plan L2-3 adjacent segment degeneration with stenosis and right L2-3  compressive synovial cyst.  Plan bilateral L2-3 decompressive laminotomies and foraminotomies with resection of right L2-3 synovial cyst.  This to be followed by posterior lumbar interbody fusion utilizing interbody cages, AND autograft, and coupled with posterolateral thesis utilizing nonsegmental pedicle screw fixation and local autograft.  Risks and benefits been explained.  Patient wishes to proceed.  Anthony Soto 10/13/2024, 11:14 AM       [1]  Allergies Allergen Reactions   Levaquin   [Levofloxacin ] Itching   Penicillins Rash    Has patient had a PCN reaction causing immediate rash, facial/tongue/throat swelling, SOB or lightheadedness with hypotension: Yes Has patient had a PCN reaction causing severe rash involving mucus membranes or skin necrosis: No Has patient had a PCN reaction that required hospitalization: No Has patient had a PCN reaction occurring within the last 10 years: No If all of the above answers are NO, then may proceed with Cephalosporin use.    "
